# Patient Record
Sex: Female | Born: 1935 | Race: White | Hispanic: No | Smoking: Former smoker
Health system: Southern US, Community
[De-identification: ages and names within clinical notes are randomized; demographics above are authoritative.]

## PROBLEM LIST (undated history)

## (undated) DIAGNOSIS — I639 Cerebral infarction, unspecified: Secondary | ICD-10-CM

## (undated) DIAGNOSIS — S069XAA Unspecified intracranial injury with loss of consciousness status unknown, initial encounter: Secondary | ICD-10-CM

## (undated) DIAGNOSIS — R569 Unspecified convulsions: Secondary | ICD-10-CM

## (undated) DIAGNOSIS — S069X9A Unspecified intracranial injury with loss of consciousness of unspecified duration, initial encounter: Secondary | ICD-10-CM

## (undated) HISTORY — DX: Unspecified convulsions: R56.9

## (undated) HISTORY — DX: Cerebral infarction, unspecified: I63.9

## (undated) HISTORY — DX: Unspecified intracranial injury with loss of consciousness status unknown, initial encounter: S06.9XAA

## (undated) HISTORY — DX: Unspecified intracranial injury with loss of consciousness of unspecified duration, initial encounter: S06.9X9A

---

## 2010-07-25 ENCOUNTER — Encounter (INDEPENDENT_AMBULATORY_CARE_PROVIDER_SITE_OTHER): Payer: Self-pay | Admitting: Internal Medicine

## 2010-07-26 ENCOUNTER — Encounter (INDEPENDENT_AMBULATORY_CARE_PROVIDER_SITE_OTHER): Payer: Self-pay | Admitting: Internal Medicine

## 2010-08-17 ENCOUNTER — Inpatient Hospital Stay (HOSPITAL_COMMUNITY): Admission: EM | Admit: 2010-08-17 | Discharge: 2010-07-28 | Payer: Self-pay | Admitting: Emergency Medicine

## 2010-11-21 LAB — POCT I-STAT, CHEM 8
BUN: 21 mg/dL (ref 6–23)
Creatinine, Ser: 0.5 mg/dL (ref 0.4–1.2)
Glucose, Bld: 109 mg/dL — ABNORMAL HIGH (ref 70–99)
Potassium: 3.4 mEq/L — ABNORMAL LOW (ref 3.5–5.1)
Sodium: 141 mEq/L (ref 135–145)

## 2010-11-21 LAB — LIPID PANEL
Triglycerides: 103 mg/dL (ref <150)
VLDL: 21 mg/dL (ref 0–40)

## 2010-11-21 LAB — CK TOTAL AND CKMB (NOT AT ARMC)
CK, MB: 0.6 ng/mL (ref 0.3–4.0)
Relative Index: INVALID (ref 0.0–2.5)
Total CK: 32 U/L (ref 7–177)

## 2010-11-21 LAB — POCT CARDIAC MARKERS
CKMB, poc: <1 ng/mL — ABNORMAL LOW (ref 1.0–8.0)
Myoglobin, poc: 139 ng/mL (ref 12–200)

## 2010-11-21 LAB — CBC
HCT: 34.4 % — ABNORMAL LOW (ref 36.0–46.0)
Hemoglobin: 11.4 g/dL — ABNORMAL LOW (ref 12.0–15.0)
Hemoglobin: 12.9 g/dL (ref 12.0–15.0)
MCH: 31.9 pg (ref 26.0–34.0)
MCV: 94.2 fL (ref 78.0–100.0)
MCV: 95 fL (ref 78.0–100.0)
MCV: 95.6 fL (ref 78.0–100.0)
Platelets: 161 10*3/uL (ref 150–400)
Platelets: 183 10*3/uL (ref 150–400)
RBC: 3.6 MIL/uL — ABNORMAL LOW (ref 3.87–5.11)
RBC: 3.62 MIL/uL — ABNORMAL LOW (ref 3.87–5.11)
RBC: 4.04 MIL/uL (ref 3.87–5.11)
WBC: 3.5 10*3/uL — ABNORMAL LOW (ref 4.0–10.5)
WBC: 4.1 10*3/uL (ref 4.0–10.5)
WBC: 6.8 10*3/uL (ref 4.0–10.5)

## 2010-11-21 LAB — PROTIME-INR
INR: 1.04 (ref 0.00–1.49)
Prothrombin Time: 13.8 seconds (ref 11.6–15.2)

## 2010-11-21 LAB — GLUCOSE, CAPILLARY
Glucose-Capillary: 104 mg/dL — ABNORMAL HIGH (ref 70–99)
Glucose-Capillary: 105 mg/dL — ABNORMAL HIGH (ref 70–99)
Glucose-Capillary: 126 mg/dL — ABNORMAL HIGH (ref 70–99)
Glucose-Capillary: 180 mg/dL — ABNORMAL HIGH (ref 70–99)
Glucose-Capillary: 84 mg/dL (ref 70–99)
Glucose-Capillary: 92 mg/dL (ref 70–99)
Glucose-Capillary: 94 mg/dL (ref 70–99)
Glucose-Capillary: 94 mg/dL (ref 70–99)

## 2010-11-21 LAB — URINALYSIS, ROUTINE W REFLEX MICROSCOPIC
Glucose, UA: NEGATIVE mg/dL
Hgb urine dipstick: NEGATIVE
Ketones, ur: 15 mg/dL — AB
Protein, ur: NEGATIVE mg/dL
pH: 6 (ref 5.0–8.0)

## 2010-11-21 LAB — BASIC METABOLIC PANEL
Chloride: 108 mEq/L (ref 96–112)
Chloride: 112 mEq/L (ref 96–112)
Creatinine, Ser: 0.54 mg/dL (ref 0.4–1.2)
GFR calc Af Amer: >60 mL/min (ref >60)
GFR calc Af Amer: >60 mL/min (ref >60)
Potassium: 3.2 mEq/L — ABNORMAL LOW (ref 3.5–5.1)

## 2010-11-21 LAB — DIFFERENTIAL
Eosinophils Relative: 1 % (ref 0–5)
Eosinophils Relative: 4 % (ref 0–5)
Lymphocytes Relative: 27 % (ref 12–46)
Lymphocytes Relative: 31 % (ref 12–46)
Lymphs Abs: 1.1 10*3/uL (ref 0.7–4.0)
Lymphs Abs: 1.8 10*3/uL (ref 0.7–4.0)
Monocytes Absolute: 0.3 10*3/uL (ref 0.1–1.0)
Monocytes Relative: 8 % (ref 3–12)
Monocytes Relative: 9 % (ref 3–12)
Neutrophils Relative %: 63 % (ref 43–77)

## 2010-11-21 LAB — PHENYTOIN LEVEL, TOTAL: Phenytoin Lvl: 12.5 ug/mL (ref 10.0–20.0)

## 2010-11-21 LAB — COMPREHENSIVE METABOLIC PANEL
BUN: 18 mg/dL (ref 6–23)
Calcium: 8.9 mg/dL (ref 8.4–10.5)
Creatinine, Ser: 0.54 mg/dL (ref 0.4–1.2)
Glucose, Bld: 91 mg/dL (ref 70–99)
Total Protein: 7.1 g/dL (ref 6.0–8.3)

## 2010-11-21 LAB — MRSA PCR SCREENING: MRSA by PCR: NEGATIVE

## 2010-11-21 LAB — LEGIONELLA ANTIGEN, URINE: Legionella Antigen, Urine: NEGATIVE

## 2010-11-21 LAB — APTT: aPTT: 29 seconds (ref 24–37)

## 2010-11-21 LAB — TROPONIN I: Troponin I: 0.01 ng/mL (ref 0.00–0.06)

## 2012-08-04 ENCOUNTER — Telehealth: Payer: Self-pay | Admitting: *Deleted

## 2012-08-04 NOTE — Telephone Encounter (Signed)
Patient was denied micardis. [employer] needs documentation of the preferred list of medications that patient has tried in the past. [employer] stated that patient was very upset. They would like a reply back today because patient is almost out of med.

## 2012-08-04 NOTE — Telephone Encounter (Signed)
Please call pt and as her what meds she has tried for her blood pressure.  I know she has tried various meds.  Thanks.

## 2012-08-05 NOTE — Telephone Encounter (Signed)
This was documented in the wrong chart. I have copied and pasted this information into MRN [id number] which is the correct patient.

## 2012-08-05 NOTE — Telephone Encounter (Signed)
Attempted to call patient;"network difficulty with line"

## 2012-11-25 ENCOUNTER — Non-Acute Institutional Stay (SKILLED_NURSING_FACILITY): Payer: Medicare Other | Admitting: Internal Medicine

## 2012-11-25 DIAGNOSIS — E7800 Pure hypercholesterolemia, unspecified: Secondary | ICD-10-CM

## 2012-11-25 DIAGNOSIS — G250 Essential tremor: Secondary | ICD-10-CM

## 2012-11-25 DIAGNOSIS — K5900 Constipation, unspecified: Secondary | ICD-10-CM

## 2012-11-25 DIAGNOSIS — F329 Major depressive disorder, single episode, unspecified: Secondary | ICD-10-CM

## 2012-11-25 DIAGNOSIS — I6990 Unspecified sequelae of unspecified cerebrovascular disease: Secondary | ICD-10-CM

## 2012-11-25 DIAGNOSIS — R569 Unspecified convulsions: Secondary | ICD-10-CM

## 2012-11-25 DIAGNOSIS — G252 Other specified forms of tremor: Secondary | ICD-10-CM

## 2012-11-25 NOTE — Progress Notes (Signed)
Date: 11/25/2012  MRN:  [id number] Name:  Lauren Burch Sex:  female Age:  77 y.o. DOB:[birth year]  Routine visit   Chief complaint: Manage sz d/o, CVA & hyperlipidemia  HPI: Reassessment of ongoing problems: Hyperlipidemia: Pt is on a statin & tolerates it without any side effects.  In 11/13 FLP nl.  LDL is at goal.  CVA: pt's neurological status is stable.  The pt denies loss of vision, transient ischemic sx, sudden weakness or numbness, difficulty swallowing, or confusion.  No complications from the medications presently being used.  She is s/p right MCA distribution infarct.  She improved remarkably on rehab & now residing as a long term care resident.  Seizure d/o: she is tolerating her anti-sz meds without any side effects.  Staff do not report any sz activity.  PMH: Reviewed.  Medications: Reviewed per MAR.  ROS: Gen: no change in appetite, fatigue or change in wt Respiratory: no cough, DOE, sob or wheezing Cardiac: no chest pain, edema or palpitations GI: no abd. Pain, diarrhea, constipation, heart burn, nausea or vomiting  Physical Exam:  VS: 115/68    72    18    97.6  Gen: NAD, obese Neck: No JVD, no neck masses, trachea midline, no thyromegaly Respiratory: breathing is even & unlabored, BS CTAB Cardiac: RRR, no murmurs, no edema, no extra heart sounds GI: normal BS, NT, ND, no hepatosplenomegaly  Labs:  11/13 dilantin level 10.7, phenobarbital level 24.9, Hb 11.2, mcv 87 ow cbc nl, cmp nl  Assessment/Plan:  1) Hyperlipidemia- LDL at goal.  Cont. Statin. 2) Depression- denies sx.  Cont. Med. 3) Essential tremor- on neurontin. 4) CVA- stable. 5) Constipation- well controlled. 6) Sz d/o- well controlled.

## 2012-11-28 ENCOUNTER — Other Ambulatory Visit: Payer: Self-pay | Admitting: *Deleted

## 2012-11-28 MED ORDER — PHENOBARBITAL 32.4 MG PO TABS: ORAL_TABLET | ORAL | Status: DC

## 2012-12-12 ENCOUNTER — Other Ambulatory Visit: Payer: Self-pay | Admitting: *Deleted

## 2012-12-12 MED ORDER — PHENOBARBITAL 64.8 MG PO TABS: ORAL_TABLET | ORAL | Status: DC

## 2013-01-12 ENCOUNTER — Other Ambulatory Visit: Payer: Self-pay | Admitting: Geriatric Medicine

## 2013-01-12 MED ORDER — PHENOBARBITAL 64.8 MG PO TABS: ORAL_TABLET | ORAL | Status: DC

## 2013-03-05 ENCOUNTER — Non-Acute Institutional Stay (SKILLED_NURSING_FACILITY): Payer: Medicare Other | Admitting: Internal Medicine

## 2013-03-05 DIAGNOSIS — I6990 Unspecified sequelae of unspecified cerebrovascular disease: Secondary | ICD-10-CM | POA: Insufficient documentation

## 2013-03-05 DIAGNOSIS — E7800 Pure hypercholesterolemia, unspecified: Secondary | ICD-10-CM

## 2013-03-05 DIAGNOSIS — R569 Unspecified convulsions: Secondary | ICD-10-CM

## 2013-03-05 DIAGNOSIS — K5900 Constipation, unspecified: Secondary | ICD-10-CM

## 2013-03-05 NOTE — Progress Notes (Signed)
PROGRESS NOTE  DATE: 03/05/2013  FACILITY: Nursing Home Location: [facility]  LEVEL OF CARE: SNF (31)  Routine Visit  CHIEF COMPLAINT:  Manage Sz disorder & CVA  HISTORY OF PRESENT ILLNESS:  REASSESSMENT OF ONGOING PROBLEM(S):  SEIZURE DISORDER: The patient's seizure disorder remains stable. No complications reported from the medications presently being used. Staff do not report any recent seizure activity.  CVA: The patient's CVA remains stable.  Patient denies new neurologic symptoms such as numbness, tingling, weakness, speech difficulties or visual disturbances.  No complications reported from the medications currently being used.  PAST MEDICAL HISTORY : Reviewed.  No changes.  CURRENT MEDICATIONS: Reviewed per [facility]  REVIEW OF SYSTEMS:  GENERAL: no change in appetite, no fatigue, no weight changes, no fever, chills or weakness RESPIRATORY: no cough, SOB, DOE, wheezing, hemoptysis CARDIAC: no chest pain, edema or palpitations GI: no abdominal pain, diarrhea, constipation, heart burn, nausea or vomiting  PHYSICAL EXAMINATION  VS:  T 97.5       P75      RR18      BP 107/71     POX % 98     WT (Lb)  GENERAL: no acute distress, obese body habitus NECK: supple, trachea midline, no neck masses, no thyroid tenderness, no thyromegaly RESPIRATORY: breathing is even & unlabored, BS CTAB CARDIAC: RRR, no murmur,no extra heart sounds, no edema GI: abdomen soft, normal BS, no masses, no tenderness, no hepatomegaly, no splenomegaly PSYCHIATRIC: the patient is alert & oriented to person, affect & behavior appropriate  LABS/RADIOLOGY:  6/14 FLP nl, liver profile nl, dilantin level 16.3, cbc nl, cmp nl  ASSESSMENT/PLAN:  Sz disorder-stable. CVA-no new sx. Hyperlipidemia-well controlled. Constipation-well controlled. GERD-stable. Essential tremor-on neurontin. Check phenobarbital level.  CPT CODE: 40981

## 2013-03-26 ENCOUNTER — Non-Acute Institutional Stay (SKILLED_NURSING_FACILITY): Payer: Medicare Other | Admitting: Internal Medicine

## 2013-03-26 DIAGNOSIS — I6990 Unspecified sequelae of unspecified cerebrovascular disease: Secondary | ICD-10-CM

## 2013-03-26 DIAGNOSIS — R569 Unspecified convulsions: Secondary | ICD-10-CM

## 2013-03-26 DIAGNOSIS — E7800 Pure hypercholesterolemia, unspecified: Secondary | ICD-10-CM

## 2013-03-26 DIAGNOSIS — K5900 Constipation, unspecified: Secondary | ICD-10-CM

## 2013-03-30 NOTE — Progress Notes (Signed)
PROGRESS NOTE  DATE: 03-26-13  FACILITY: Nursing Home Location: [facility]  LEVEL OF CARE: SNF (31)  Routine Visit  CHIEF COMPLAINT:  Manage Sz disorder & CVA  HISTORY OF PRESENT ILLNESS:  REASSESSMENT OF ONGOING PROBLEM(S):  SEIZURE DISORDER: The patient's seizure disorder remains stable. No complications reported from the medications presently being used. Staff do not report any recent seizure activity.  CVA: The patient's CVA remains stable.  Patient denies new neurologic symptoms such as numbness, tingling, weakness, speech difficulties or visual disturbances.  No complications reported from the medications currently being used.  PAST MEDICAL HISTORY : Reviewed.  No changes.  CURRENT MEDICATIONS: Reviewed per [facility]  REVIEW OF SYSTEMS:  GENERAL: no change in appetite, no fatigue, no weight changes, no fever, chills or weakness RESPIRATORY: no cough, SOB, DOE, wheezing, hemoptysis CARDIAC: no chest pain, edema or palpitations GI: no abdominal pain, diarrhea, constipation, heart burn, nausea or vomiting  PHYSICAL EXAMINATION  VS:  T 98.8       P88      RR22      BP 139/72     POX % 95     WT (Lb)  GENERAL: no acute distress, obese body habitus NECK: supple, trachea midline, no neck masses, no thyroid tenderness, no thyromegaly RESPIRATORY: breathing is even & unlabored, BS CTAB CARDIAC: RRR, no murmur,no extra heart sounds, no edema GI: abdomen soft, normal BS, no masses, no tenderness, no hepatomegaly, no splenomegaly PSYCHIATRIC: the patient is alert & oriented to person, affect & behavior appropriate  LABS/RADIOLOGY:  6/14 FLP nl, liver profile nl, dilantin level 16.3, cbc nl, cmp nl, phenobarbital 36.6  ASSESSMENT/PLAN:  Sz disorder-stable. CVA-no new sx. Hyperlipidemia-well controlled. Constipation-well controlled. GERD-stable. Essential tremor-on neurontin  CPT CODE: 16109

## 2013-05-01 ENCOUNTER — Non-Acute Institutional Stay (SKILLED_NURSING_FACILITY): Payer: Medicare Other | Admitting: Adult Health

## 2013-05-01 DIAGNOSIS — K5900 Constipation, unspecified: Secondary | ICD-10-CM

## 2013-05-01 DIAGNOSIS — R569 Unspecified convulsions: Secondary | ICD-10-CM

## 2013-05-01 DIAGNOSIS — K219 Gastro-esophageal reflux disease without esophagitis: Secondary | ICD-10-CM

## 2013-05-01 DIAGNOSIS — G250 Essential tremor: Secondary | ICD-10-CM

## 2013-05-01 DIAGNOSIS — E7800 Pure hypercholesterolemia, unspecified: Secondary | ICD-10-CM

## 2013-05-01 DIAGNOSIS — I6990 Unspecified sequelae of unspecified cerebrovascular disease: Secondary | ICD-10-CM

## 2013-05-01 DIAGNOSIS — J301 Allergic rhinitis due to pollen: Secondary | ICD-10-CM

## 2013-05-01 DIAGNOSIS — Z9109 Other allergy status, other than to drugs and biological substances: Secondary | ICD-10-CM

## 2013-05-05 DIAGNOSIS — Z9109 Other allergy status, other than to drugs and biological substances: Secondary | ICD-10-CM | POA: Insufficient documentation

## 2013-05-05 DIAGNOSIS — K219 Gastro-esophageal reflux disease without esophagitis: Secondary | ICD-10-CM | POA: Insufficient documentation

## 2013-05-05 NOTE — Progress Notes (Signed)
Patient ID: Lauren Burch, female   DOB: Mar 14, 1936, 77 y.o.   MRN: [id number]       PROGRESS NOTE  DATE: 05/01/2013  FACILITY: Nursing Home Location: [facility]  LEVEL OF CARE: SNF (31)  Routine Visit  CHIEF COMPLAINT:  Manage seizure, essential tremor, hyperlipidemia and GERD  HISTORY OF PRESENT ILLNESS:  REASSESSMENT OF ONGOING PROBLEM(S):  SEIZURE DISORDER: The patient's seizure disorder remains stable. No complications reported from the medications presently being used. Staff do not report any recent seizure activity.  GERD: pt's GERD is stable.  Denies ongoing heartburn, abd. Pain, nausea or vomiting.  Currently on a PPI & tolerates it without any adverse reactions.  CVA: The patient's CVA remains stable.  Patient denies new neurologic symptoms such as numbness, tingling, weakness, speech difficulties or visual disturbances.  No complications reported from the medications currently being used.  PAST MEDICAL HISTORY : Reviewed.  No changes.  CURRENT MEDICATIONS: Reviewed per [facility]  REVIEW OF SYSTEMS:  GENERAL: no change in appetite, no fatigue, no weight changes, no fever, chills or weakness RESPIRATORY: no cough, SOB, DOE, wheezing, hemoptysis CARDIAC: no chest pain, edema or palpitations GI: no abdominal pain, diarrhea, constipation, heart burn, nausea or vomiting  PHYSICAL EXAMINATION  VS:  T97.9       P70      RR18      BP143/68          WT 198.4(Lb)  GENERAL: no acute distress, normal body habitus NECK: supple, trachea midline, no neck masses, no thyroid tenderness, no thyromegaly LYMPHATICS: no LAN in the neck, no supraclavicular LAN RESPIRATORY: breathing is even & unlabored, BS CTAB CARDIAC: RRR, no murmur,no extra heart sounds, no edema GI: abdomen soft, normal BS, no masses, no tenderness, no hepatomegaly, no splenomegaly PSYCHIATRIC: the patient is alert & oriented to person, affect & behavior appropriate  LABS/RADIOLOGY: 6/14 FLP nl, liver  profile nl, dilantin level 16.3, cbc nl, cmp nl, phenobarbital 36.6   ASSESSMENT/PLAN:  CVA - stable; no new symptoms  Seizure - stable  Hyperlipidemia - well controlled  Constipation - no complaints  GERD - stable  Essential tremor - stable  Allergy to pollen - stable  CPT CODE: 40981

## 2013-05-12 ENCOUNTER — Other Ambulatory Visit: Payer: Self-pay | Admitting: *Deleted

## 2013-05-12 MED ORDER — PHENOBARBITAL 64.8 MG PO TABS: ORAL_TABLET | ORAL | Status: DC

## 2013-05-28 ENCOUNTER — Non-Acute Institutional Stay (SKILLED_NURSING_FACILITY): Payer: Medicare Other | Admitting: Adult Health

## 2013-05-28 DIAGNOSIS — Z9109 Other allergy status, other than to drugs and biological substances: Secondary | ICD-10-CM

## 2013-05-28 DIAGNOSIS — J301 Allergic rhinitis due to pollen: Secondary | ICD-10-CM

## 2013-05-28 DIAGNOSIS — B372 Candidiasis of skin and nail: Secondary | ICD-10-CM

## 2013-05-28 DIAGNOSIS — E7800 Pure hypercholesterolemia, unspecified: Secondary | ICD-10-CM

## 2013-05-28 DIAGNOSIS — R569 Unspecified convulsions: Secondary | ICD-10-CM

## 2013-05-28 DIAGNOSIS — I6990 Unspecified sequelae of unspecified cerebrovascular disease: Secondary | ICD-10-CM

## 2013-05-28 DIAGNOSIS — K219 Gastro-esophageal reflux disease without esophagitis: Secondary | ICD-10-CM

## 2013-05-28 DIAGNOSIS — G250 Essential tremor: Secondary | ICD-10-CM

## 2013-05-28 DIAGNOSIS — K5900 Constipation, unspecified: Secondary | ICD-10-CM

## 2013-06-01 ENCOUNTER — Other Ambulatory Visit: Payer: Self-pay | Admitting: *Deleted

## 2013-06-01 MED ORDER — PHENOBARBITAL 32.4 MG PO TABS: ORAL_TABLET | ORAL | Status: DC

## 2013-06-09 NOTE — Progress Notes (Signed)
Patient ID: Lauren Burch, female   DOB: 1935/12/14, 77 y.o.   MRN: [id number]       PROGRESS NOTE  DATE:  05/28/13  FACILITY: Nursing Home Location: [facility]  LEVEL OF CARE: SNF (31)  Routine Visit  CHIEF COMPLAINT:  Manage seizure, essential tremor, hyperlipidemia and GERD  HISTORY OF PRESENT ILLNESS:  REASSESSMENT OF ONGOING PROBLEM(S):  HYPERLIPIDEMIA: No complications from the medications presently being used. 6/14 fasting lipid panel  cholesterol 166 triglycerides 103 HDL 66 LDL 79 - normal  SEIZURE DISORDER: The patient's seizure disorder remains stable. No complications reported from the medications presently being used. Staff do not report any recent seizure activity.  GERD: pt's GERD is stable.  Denies ongoing heartburn, abd. Pain, nausea or vomiting.  Currently on a PPI & tolerates it without any adverse reactions.  PAST MEDICAL HISTORY : Reviewed.  No changes.  CURRENT MEDICATIONS: Reviewed per [facility]  REVIEW OF SYSTEMS:  GENERAL: no change in appetite, no fatigue, no weight changes, no fever, chills or weakness  SKIN: pruritic rashes RESPIRATORY: no cough, SOB, DOE, wheezing, hemoptysis CARDIAC: no chest pain, edema or palpitations GI: no abdominal pain, diarrhea, constipation, heart burn, nausea or vomiting  PHYSICAL EXAMINATION  VS:  T 97.8    P 81    RR18      BP 131/56          WT 195 (Lb)  GENERAL: no acute distress, normal body habitus SKIN:  Erythematous rashes to abdominal folds and left groin NECK: supple, trachea midline, no neck masses, no thyroid tenderness, no thyromegaly RESPIRATORY: breathing is even & unlabored, BS CTAB CARDIAC: RRR, no murmur,no extra heart sounds, no edema GI: abdomen soft, normal BS, no masses, no tenderness, no hepatomegaly, no splenomegaly PSYCHIATRIC: the patient is alert & oriented to person, affect & behavior appropriate  LABS/RADIOLOGY: 6/14 FLP nl, liver profile nl, dilantin level 16.3, cbc nl, cmp  nl, phenobarbital 36.6   ASSESSMENT/PLAN:  Candidal skin infection - apply nystatin 100,000 units/gram powder to abdominal folds in the left groin rash has BID x2 weeks  CVA - stable; no new symptoms  Seizure - stable  Hyperlipidemia - well controlled  Constipation - no complaints  GERD - stable  Essential tremor - stable  Allergy to pollen - stable  CPT CODE: 78295

## 2013-07-03 ENCOUNTER — Non-Acute Institutional Stay (SKILLED_NURSING_FACILITY): Payer: PRIVATE HEALTH INSURANCE | Admitting: Internal Medicine

## 2013-07-03 DIAGNOSIS — E7800 Pure hypercholesterolemia, unspecified: Secondary | ICD-10-CM

## 2013-07-03 DIAGNOSIS — K5900 Constipation, unspecified: Secondary | ICD-10-CM

## 2013-07-03 DIAGNOSIS — R569 Unspecified convulsions: Secondary | ICD-10-CM

## 2013-07-03 DIAGNOSIS — I6990 Unspecified sequelae of unspecified cerebrovascular disease: Secondary | ICD-10-CM

## 2013-07-03 NOTE — Progress Notes (Signed)
PROGRESS NOTE  DATE: 07-03-13  FACILITY: Nursing Home Location: [facility]  LEVEL OF CARE: SNF (31)  Routine Visit  CHIEF COMPLAINT:  Manage Sz disorder, constipation & CVA  HISTORY OF PRESENT ILLNESS:  REASSESSMENT OF ONGOING PROBLEM(S):  CONSTIPATION: The constipation remains stable. No complications from the medications presently being used. Patient denies ongoing constipation, abdominal pain, nausea or vomiting.  SEIZURE DISORDER: The patient's seizure disorder remains stable. No complications reported from the medications presently being used. Staff do not report any recent seizure activity.  CVA: The patient's CVA remains stable.  Patient denies new neurologic symptoms such as numbness, tingling, weakness, speech difficulties or visual disturbances.  No complications reported from the medications currently being used.  PAST MEDICAL HISTORY : Reviewed.  No changes.  CURRENT MEDICATIONS: Reviewed per [facility]  REVIEW OF SYSTEMS:  GENERAL: no change in appetite, no fatigue, no weight changes, no fever, chills or weakness RESPIRATORY: no cough, SOB, DOE, wheezing, hemoptysis CARDIAC: no chest pain, edema or palpitations GI: no abdominal pain, diarrhea, constipation, heart burn, nausea or vomiting  PHYSICAL EXAMINATION  VS:  T 97.8       P85      RR20      BP 130/78     POX % 99     WT (Lb)  GENERAL: no acute distress, obese body habitus EYES: nl sclerae, nl conjunctivae, no discharge NECK: supple, trachea midline, no neck masses, no thyroid tenderness, no thyromegaly LYMPHATICS: no cervical LAN, no supraclavicular LAN RESPIRATORY: breathing is even & unlabored, BS CTAB CARDIAC: RRR, no murmur,no extra heart sounds, no edema GI: abdomen soft, normal BS, no masses, no tenderness, no hepatomegaly, no splenomegaly PSYCHIATRIC: the patient is alert & oriented to person, affect & behavior appropriate  LABS/RADIOLOGY:  6/14 FLP nl, liver profile nl,  dilantin level 16.3, cbc nl, cmp nl, phenobarbital 36.6  ASSESSMENT/PLAN:  Sz disorder-stable. CVA-no new sx. Hyperlipidemia-well controlled. Constipation-well controlled. GERD-stable. Essential tremor-on neurontin  CPT CODE: 96045

## 2013-07-22 ENCOUNTER — Non-Acute Institutional Stay (SKILLED_NURSING_FACILITY): Payer: PRIVATE HEALTH INSURANCE | Admitting: Internal Medicine

## 2013-07-22 DIAGNOSIS — K5900 Constipation, unspecified: Secondary | ICD-10-CM

## 2013-07-22 DIAGNOSIS — I6990 Unspecified sequelae of unspecified cerebrovascular disease: Secondary | ICD-10-CM

## 2013-07-22 DIAGNOSIS — E7800 Pure hypercholesterolemia, unspecified: Secondary | ICD-10-CM

## 2013-07-22 DIAGNOSIS — R569 Unspecified convulsions: Secondary | ICD-10-CM

## 2013-07-24 NOTE — Progress Notes (Signed)
PROGRESS NOTE  DATE: 07-22-13  FACILITY: Nursing Home Location: [facility]  LEVEL OF CARE: SNF (31)  Routine Visit  CHIEF COMPLAINT:  Manage Sz disorder, constipation & CVA  HISTORY OF PRESENT ILLNESS:  REASSESSMENT OF ONGOING PROBLEM(S):  CONSTIPATION: The constipation remains stable. No complications from the medications presently being used. Patient denies ongoing constipation, abdominal pain, nausea or vomiting.  SEIZURE DISORDER: The patient's seizure disorder remains stable. No complications reported from the medications presently being used. Staff do not report any recent seizure activity.  CVA: The patient's CVA remains stable.  Patient denies new neurologic symptoms such as numbness, tingling, weakness, speech difficulties or visual disturbances.  No complications reported from the medications currently being used.  PAST MEDICAL HISTORY : Reviewed.  No changes.  CURRENT MEDICATIONS: Reviewed per [facility]  REVIEW OF SYSTEMS:  GENERAL: no change in appetite, no fatigue, no weight changes, no fever, chills or weakness RESPIRATORY: no cough, SOB, DOE, wheezing, hemoptysis CARDIAC: no chest pain, edema or palpitations GI: no abdominal pain, diarrhea, constipation, heart burn, nausea or vomiting  PHYSICAL EXAMINATION  VS:  T 97.1    P 71      RR18      BP 100/63     POX % 98    WT (Lb)  GENERAL: no acute distress, obese body habitus EYES: nl sclerae, nl conjunctivae, no discharge NECK: supple, trachea midline, no neck masses, no thyroid tenderness, no thyromegaly LYMPHATICS: no cervical LAN, no supraclavicular LAN RESPIRATORY: breathing is even & unlabored, BS CTAB CARDIAC: RRR, no murmur,no extra heart sounds, no edema GI: abdomen soft, normal BS, no masses, no tenderness, no hepatomegaly, no splenomegaly PSYCHIATRIC: the patient is alert & oriented to person, affect & behavior appropriate  LABS/RADIOLOGY:  6/14 FLP nl, liver profile nl,  dilantin level 16.3, cbc nl, cmp nl, phenobarbital 36.6  ASSESSMENT/PLAN:  Sz disorder-stable. CVA-no new sx. Hyperlipidemia-well controlled. Constipation-well controlled. GERD-stable. Essential tremor-on neurontin  CPT CODE: 40981

## 2013-08-19 ENCOUNTER — Non-Acute Institutional Stay (SKILLED_NURSING_FACILITY): Payer: PRIVATE HEALTH INSURANCE | Admitting: Internal Medicine

## 2013-08-19 DIAGNOSIS — K5900 Constipation, unspecified: Secondary | ICD-10-CM

## 2013-08-19 DIAGNOSIS — E7800 Pure hypercholesterolemia, unspecified: Secondary | ICD-10-CM

## 2013-08-19 DIAGNOSIS — R569 Unspecified convulsions: Secondary | ICD-10-CM

## 2013-08-19 DIAGNOSIS — I6990 Unspecified sequelae of unspecified cerebrovascular disease: Secondary | ICD-10-CM

## 2013-08-21 ENCOUNTER — Encounter: Payer: Self-pay | Admitting: Internal Medicine

## 2013-08-21 NOTE — Progress Notes (Signed)
PROGRESS NOTE  DATE: 08-19-13  FACILITY: Nursing Home Location: [facility]  LEVEL OF CARE: SNF (31)  Routine Visit  CHIEF COMPLAINT:  Manage Sz disorder, constipation & CVA  HISTORY OF PRESENT ILLNESS:  REASSESSMENT OF ONGOING PROBLEM(S):  CONSTIPATION: The constipation remains stable. No complications from the medications presently being used. Patient denies ongoing constipation, abdominal pain, nausea or vomiting.  SEIZURE DISORDER: The patient's seizure disorder remains stable. No complications reported from the medications presently being used. Staff do not report any recent seizure activity.  CVA: The patient's CVA remains stable.  Patient denies new neurologic symptoms such as numbness, tingling, weakness, speech difficulties or visual disturbances.  No complications reported from the medications currently being used.  PAST MEDICAL HISTORY : Reviewed.  No changes.  CURRENT MEDICATIONS: Reviewed per [facility]  REVIEW OF SYSTEMS:  GENERAL: no change in appetite, no fatigue, no weight changes, no fever, chills or weakness RESPIRATORY: no cough, SOB, DOE, wheezing, hemoptysis CARDIAC: no chest pain, edema or palpitations GI: no abdominal pain, diarrhea, constipation, heart burn, nausea or vomiting  PHYSICAL EXAMINATION  VS:  T 97.5    P 68      RR 20      BP 129/60     POX % 96    WT (Lb)  GENERAL: no acute distress, obese body habitus EYES: nl sclerae, nl conjunctivae, no discharge NECK: supple, trachea midline, no neck masses, no thyroid tenderness, no thyromegaly LYMPHATICS: no cervical LAN, no supraclavicular LAN RESPIRATORY: breathing is even & unlabored, BS CTAB CARDIAC: RRR, no murmur,no extra heart sounds, no edema GI: abdomen soft, normal BS, no masses, no tenderness, no hepatomegaly, no splenomegaly PSYCHIATRIC: the patient is alert & oriented to person, affect & behavior appropriate  LABS/RADIOLOGY:  11-14 fasting lipid panel normal,  phenobarbital level 33.1, Dilantin 17.4, glucose 138 otherwise BMP normal, liver profile normal, CBC normal  6/14 FLP nl, liver profile nl, dilantin level 16.3, cbc nl, cmp nl, phenobarbital 36.6  ASSESSMENT/PLAN:  Sz disorder-stable. CVA-no new sx. Hyperlipidemia-well controlled. Constipation-well controlled. GERD-stable. Essential tremor-on neurontin  CPT CODE: 16109

## 2013-09-25 ENCOUNTER — Non-Acute Institutional Stay (SKILLED_NURSING_FACILITY): Payer: PRIVATE HEALTH INSURANCE | Admitting: Internal Medicine

## 2013-09-25 DIAGNOSIS — E7800 Pure hypercholesterolemia, unspecified: Secondary | ICD-10-CM

## 2013-09-25 DIAGNOSIS — R569 Unspecified convulsions: Secondary | ICD-10-CM

## 2013-09-25 DIAGNOSIS — K5900 Constipation, unspecified: Secondary | ICD-10-CM

## 2013-09-25 DIAGNOSIS — I6990 Unspecified sequelae of unspecified cerebrovascular disease: Secondary | ICD-10-CM

## 2013-09-25 NOTE — Progress Notes (Signed)
         PROGRESS NOTE  DATE: 09-25-13  FACILITY: Nursing Home Location: [facility]  LEVEL OF CARE: SNF (31)  Routine Visit  CHIEF COMPLAINT:  Manage Sz disorder, constipation & CVA  HISTORY OF PRESENT ILLNESS:  REASSESSMENT OF ONGOING PROBLEM(S):  CONSTIPATION: The constipation remains stable. No complications from the medications presently being used. Patient denies ongoing constipation, abdominal pain, nausea or vomiting.  SEIZURE DISORDER: The patient's seizure disorder remains stable. No complications reported from the medications presently being used. Staff do not report any recent seizure activity.  CVA: The patient's CVA remains stable.  Patient denies new neurologic symptoms such as numbness, tingling, weakness, speech difficulties or visual disturbances.  No complications reported from the medications currently being used.  PAST MEDICAL HISTORY : Reviewed.  No changes.  [facility]S: [facility]  REVIEW OF SYSTEMS:  GENERAL: no change in appetite, no fatigue, no weight changes, no fever, chills or weakness RESPIRATORY: no cough, SOB, DOE, wheezing, hemoptysis CARDIAC: no chest pain, edema or palpitations GI: no abdominal pain, diarrhea, constipation, heart burn, nausea or vomiting  PHYSICAL EXAMINATION  VS:  T 97.7    P 77      RR 18      BP 111/66     POX % 97     GENERAL: no acute distress, obese body habitus EYES: nl sclerae, nl conjunctivae, no discharge NECK: supple, trachea midline, no neck masses, no thyroid tenderness, no thyromegaly LYMPHATICS: no cervical LAN, no supraclavicular LAN RESPIRATORY: breathing is even & unlabored, BS CTAB CARDIAC: RRR, no murmur,no extra heart sounds, no edema GI: abdomen soft, normal BS, no masses, no tenderness, no hepatomegaly, no splenomegaly PSYCHIATRIC: the patient is alert & oriented to person, affect & behavior appropriate  LABS/RADIOLOGY:  11-14 fasting lipid panel normal,  phenobarbital level 33.1, Dilantin 17.4, glucose 138 otherwise BMP normal, liver profile normal, CBC normal  6/14 FLP nl, liver profile nl, dilantin level 16.3, cbc nl, cmp nl, phenobarbital 36.6  ASSESSMENT/PLAN:  Sz disorder-stable. CVA-no new sx. Hyperlipidemia-well controlled. Constipation-well controlled. GERD-stable. Essential tremor-on neurontin  CPT CODE: [id number]

## 2013-11-05 ENCOUNTER — Encounter: Payer: Self-pay | Admitting: *Deleted

## 2013-11-09 ENCOUNTER — Other Ambulatory Visit: Payer: Self-pay | Admitting: *Deleted

## 2013-11-09 MED ORDER — PHENOBARBITAL 64.8 MG PO TABS: ORAL_TABLET | ORAL | Status: DC

## 2013-11-09 NOTE — Telephone Encounter (Signed)
[facility] 

## 2013-11-25 ENCOUNTER — Other Ambulatory Visit: Payer: Self-pay | Admitting: *Deleted

## 2013-11-25 MED ORDER — PHENOBARBITAL 32.4 MG PO TABS: ORAL_TABLET | ORAL | Status: DC

## 2013-11-25 NOTE — Telephone Encounter (Signed)
[facility] 

## 2013-12-29 ENCOUNTER — Non-Acute Institutional Stay (SKILLED_NURSING_FACILITY): Payer: PRIVATE HEALTH INSURANCE | Admitting: Internal Medicine

## 2013-12-29 DIAGNOSIS — I6990 Unspecified sequelae of unspecified cerebrovascular disease: Secondary | ICD-10-CM

## 2013-12-29 DIAGNOSIS — K5900 Constipation, unspecified: Secondary | ICD-10-CM

## 2013-12-29 DIAGNOSIS — R569 Unspecified convulsions: Secondary | ICD-10-CM

## 2013-12-29 DIAGNOSIS — E7800 Pure hypercholesterolemia, unspecified: Secondary | ICD-10-CM

## 2013-12-30 NOTE — Progress Notes (Signed)
          PROGRESS NOTE  DATE: 12-29-13  FACILITY: Nursing Home Location: [facility]  LEVEL OF CARE: SNF (31)  Routine Visit  CHIEF COMPLAINT:  Manage Sz disorder, constipation & CVA  HISTORY OF PRESENT ILLNESS:  REASSESSMENT OF ONGOING PROBLEM(S):  CONSTIPATION: The constipation remains stable. No complications from the medications presently being used. Patient denies ongoing constipation, abdominal pain, nausea or vomiting.  SEIZURE DISORDER: The patient's seizure disorder remains stable. No complications reported from the medications presently being used. Staff do not report any recent seizure activity.  CVA: The patient's CVA remains stable.  Patient denies new neurologic symptoms such as numbness, tingling, weakness, speech difficulties or visual disturbances.  No complications reported from the medications currently being used.  PAST MEDICAL HISTORY : Reviewed.  No changes.  [facility]  REVIEW OF SYSTEMS:  GENERAL: no change in appetite, no fatigue, no weight changes, no fever, chills or weakness RESPIRATORY: no cough, SOB, DOE, wheezing, hemoptysis CARDIAC: no chest pain, edema or palpitations GI: no abdominal pain, diarrhea, constipation, heart burn, nausea or vomiting  PHYSICAL EXAMINATION  VS: See VS Section    GENERAL: no acute distress, obese body habitus EYES: nl sclerae, nl conjunctivae, no discharge NECK: supple, trachea midline, no neck masses, no thyroid tenderness, no thyromegaly LYMPHATICS: no cervical LAN, no supraclavicular LAN RESPIRATORY: breathing is even & unlabored, BS CTAB CARDIAC: RRR, no murmur,no extra heart sounds, no edema GI: abdomen soft, normal BS, no masses, no tenderness, no hepatomegaly, no splenomegaly PSYCHIATRIC: the patient is alert & oriented to person, affect & behavior appropriate  LABS/RADIOLOGY: 3-15 CBC normal, glucose 152 otherwise BMP normal, phenobarbital level 29.7,  Dilantin level 15.5 11-14 fasting lipid panel normal, phenobarbital level 33.1, Dilantin 17.4, glucose 138 otherwise BMP normal, liver profile normal, CBC normal  6/14 FLP nl, liver profile nl, dilantin level 16.3, cbc nl, cmp nl, phenobarbital 36.6  ASSESSMENT/PLAN:  Sz disorder-stable. CVA-no new sx. Hyperlipidemia-well controlled. Constipation-well controlled. GERD-stable. Essential tremor-on neurontin Allergic rhinitis-Claritin was started  CPT CODE: [id number]

## 2014-01-28 ENCOUNTER — Non-Acute Institutional Stay (SKILLED_NURSING_FACILITY): Payer: PRIVATE HEALTH INSURANCE | Admitting: Internal Medicine

## 2014-01-28 DIAGNOSIS — K5900 Constipation, unspecified: Secondary | ICD-10-CM

## 2014-01-28 DIAGNOSIS — R569 Unspecified convulsions: Secondary | ICD-10-CM

## 2014-01-28 DIAGNOSIS — I6990 Unspecified sequelae of unspecified cerebrovascular disease: Secondary | ICD-10-CM

## 2014-01-28 DIAGNOSIS — E7800 Pure hypercholesterolemia, unspecified: Secondary | ICD-10-CM

## 2014-01-29 NOTE — Progress Notes (Signed)
         PROGRESS NOTE  DATE: 01-28-14  FACILITY: Nursing Home Location: [facility]  LEVEL OF CARE: SNF (31)  Routine Visit  CHIEF COMPLAINT:  Manage Sz disorder, constipation & CVA  HISTORY OF PRESENT ILLNESS:  REASSESSMENT OF ONGOING PROBLEM(S):  CONSTIPATION: The constipation remains stable. No complications from the medications presently being used. Patient denies ongoing constipation, abdominal pain, nausea or vomiting.  SEIZURE DISORDER: The patient's seizure disorder remains stable. No complications reported from the medications presently being used. Staff do not report any recent seizure activity.  CVA: The patient's CVA remains stable.  Patient denies new neurologic symptoms such as numbness, tingling, weakness, speech difficulties or visual disturbances.  No complications reported from the medications currently being used.  PAST MEDICAL HISTORY : Reviewed.  No changes.  CURRENT MEDICATIONS: Reviewed per [facility]  REVIEW OF SYSTEMS:  GENERAL: no change in appetite, no fatigue, no weight changes, no fever, chills or weakness RESPIRATORY: no cough, SOB, DOE, wheezing, hemoptysis CARDIAC: no chest pain, edema or palpitations GI: no abdominal pain, diarrhea, constipation, heart burn, nausea or vomiting  PHYSICAL EXAMINATION  VS: See VS Section    GENERAL: no acute distress, obese body habitus EYES: nl sclerae, nl conjunctivae, no discharge NECK: supple, trachea midline, no neck masses, no thyroid tenderness, no thyromegaly LYMPHATICS: no cervical LAN, no supraclavicular LAN RESPIRATORY: breathing is even & unlabored, BS CTAB CARDIAC: RRR, no murmur,no extra heart sounds, no edema GI: abdomen soft, normal BS, no masses, no tenderness, no hepatomegaly, no splenomegaly PSYCHIATRIC: the patient is alert & oriented to person, affect & behavior appropriate  LABS/RADIOLOGY: 3-15 CBC normal, glucose 152 otherwise BMP normal, phenobarbital level 29.7, Dilantin  level 15.5 11-14 fasting lipid panel normal, phenobarbital level 33.1, Dilantin 17.4, glucose 138 otherwise BMP normal, liver profile normal, CBC normal  6/14 FLP nl, liver profile nl, dilantin level 16.3, cbc nl, cmp nl, phenobarbital 36.6  ASSESSMENT/PLAN:  Sz disorder-stable. CVA-no new sx. Hyperlipidemia-check fasting lipid panel Constipation-Colace was started GERD-stable. Essential tremor-on neurontin Allergic rhinitis-Claritin was started Check liver profile  CPT CODE: 76734  Newton Pigg. Kerry Dory, MD [facility] [phone]

## 2014-02-02 ENCOUNTER — Other Ambulatory Visit: Payer: Self-pay

## 2014-02-02 ENCOUNTER — Other Ambulatory Visit: Payer: Self-pay | Admitting: Internal Medicine

## 2014-02-02 DIAGNOSIS — Z1231 Encounter for screening mammogram for malignant neoplasm of breast: Secondary | ICD-10-CM

## 2014-02-17 ENCOUNTER — Ambulatory Visit
Admission: RE | Admit: 2014-02-17 | Discharge: 2014-02-17 | Disposition: A | Discharge status: Home / Self Care | Payer: Medicare Other | Source: Ambulatory Visit | Attending: Internal Medicine | Admitting: Internal Medicine

## 2014-02-17 ENCOUNTER — Encounter (INDEPENDENT_AMBULATORY_CARE_PROVIDER_SITE_OTHER): Payer: Self-pay

## 2014-02-17 DIAGNOSIS — Z1231 Encounter for screening mammogram for malignant neoplasm of breast: Secondary | ICD-10-CM

## 2014-02-18 ENCOUNTER — Non-Acute Institutional Stay (SKILLED_NURSING_FACILITY): Payer: PRIVATE HEALTH INSURANCE | Admitting: Internal Medicine

## 2014-02-18 DIAGNOSIS — K5900 Constipation, unspecified: Secondary | ICD-10-CM

## 2014-02-18 DIAGNOSIS — E7800 Pure hypercholesterolemia, unspecified: Secondary | ICD-10-CM

## 2014-02-18 DIAGNOSIS — I6990 Unspecified sequelae of unspecified cerebrovascular disease: Secondary | ICD-10-CM

## 2014-02-18 DIAGNOSIS — R569 Unspecified convulsions: Secondary | ICD-10-CM

## 2014-02-18 NOTE — Progress Notes (Signed)
         PROGRESS NOTE  DATE: 02-18-14  FACILITY: Nursing Home Location: [facility]  LEVEL OF CARE: SNF (31)  Routine Visit  CHIEF COMPLAINT:  Manage Sz disorder, constipation & CVA  HISTORY OF PRESENT ILLNESS:  REASSESSMENT OF ONGOING PROBLEM(S):  CONSTIPATION: The constipation remains stable. No complications from the medications presently being used. Patient denies ongoing constipation, abdominal pain, nausea or vomiting.  SEIZURE DISORDER: The patient's seizure disorder remains stable. No complications reported from the medications presently being used. Staff do not report any recent seizure activity.  CVA: The patient's CVA remains stable.  Patient denies new neurologic symptoms such as numbness, tingling, weakness, speech difficulties or visual disturbances.  No complications reported from the medications currently being used.  PAST MEDICAL HISTORY : Reviewed.  No changes.  CURRENT MEDICATIONS: Reviewed per [facility]  REVIEW OF SYSTEMS:  GENERAL: no change in appetite, no fatigue, no weight changes, no fever, chills or weakness RESPIRATORY: no cough, SOB, DOE, wheezing, hemoptysis CARDIAC: no chest pain, edema or palpitations GI: no abdominal pain, diarrhea, constipation, heart burn, nausea or vomiting  PHYSICAL EXAMINATION  VS: See VS Section    GENERAL: no acute distress, obese body habitus EYES: nl sclerae, nl conjunctivae, no discharge NECK: supple, trachea midline, no neck masses, no thyroid tenderness, no thyromegaly LYMPHATICS: no cervical LAN, no supraclavicular LAN RESPIRATORY: breathing is even & unlabored, BS CTAB CARDIAC: RRR, no murmur,no extra heart sounds, no edema GI: abdomen soft, normal BS, no masses, no tenderness, no hepatomegaly, no splenomegaly PSYCHIATRIC: the patient is alert & oriented to person, affect & behavior appropriate  LABS/RADIOLOGY: 5-15 liver profile normal, fasting lipid panel normal 3-15 CBC normal, glucose 152  otherwise BMP normal, phenobarbital level 29.7, Dilantin level 15.5 11-14 fasting lipid panel normal, phenobarbital level 33.1, Dilantin 17.4, glucose 138 otherwise BMP normal, liver profile normal, CBC normal  6/14 FLP nl, liver profile nl, dilantin level 16.3, cbc nl, cmp nl, phenobarbital 36.6  ASSESSMENT/PLAN:  Sz disorder-stable. CVA-no new sx. Hyperlipidemia-well controlled Constipation-Colace was started GERD-stable. Essential tremor-on neurontin Allergic rhinitis-on Claritin  CPT CODE: 72620  Newton Pigg. Kerry Dory, MD [facility] [phone]

## 2014-03-31 ENCOUNTER — Non-Acute Institutional Stay (SKILLED_NURSING_FACILITY): Payer: PRIVATE HEALTH INSURANCE | Admitting: Internal Medicine

## 2014-03-31 DIAGNOSIS — E7800 Pure hypercholesterolemia, unspecified: Secondary | ICD-10-CM

## 2014-03-31 DIAGNOSIS — R569 Unspecified convulsions: Secondary | ICD-10-CM

## 2014-03-31 DIAGNOSIS — K5900 Constipation, unspecified: Secondary | ICD-10-CM

## 2014-03-31 DIAGNOSIS — I6990 Unspecified sequelae of unspecified cerebrovascular disease: Secondary | ICD-10-CM

## 2014-03-31 NOTE — Progress Notes (Signed)
         PROGRESS NOTE  DATE: 03-31-14  FACILITY: Nursing Home Location: [facility]  LEVEL OF CARE: SNF (31)  Routine Visit  CHIEF COMPLAINT:  Manage Sz disorder, constipation & CVA  HISTORY OF PRESENT ILLNESS:  REASSESSMENT OF ONGOING PROBLEM(S):  CONSTIPATION: The constipation remains stable. No complications from the medications presently being used. Patient denies ongoing constipation, abdominal pain, nausea or vomiting.  SEIZURE DISORDER: The patient's seizure disorder remains stable. No complications reported from the medications presently being used. Staff do not report any recent seizure activity.  CVA: The patient's CVA remains stable.  Patient denies new neurologic symptoms such as numbness, tingling, weakness, speech difficulties or visual disturbances.  No complications reported from the medications currently being used.  PAST MEDICAL HISTORY : Reviewed.  No changes.  [facility]  REVIEW OF SYSTEMS:  GENERAL: no change in appetite, no fatigue, no weight changes, no fever, chills or weakness RESPIRATORY: no cough, SOB, DOE, wheezing, hemoptysis CARDIAC: no chest pain, edema or palpitations GI: no abdominal pain, diarrhea, constipation, heart burn, nausea or vomiting  PHYSICAL EXAMINATION  VS: See VS Section    GENERAL: no acute distress, obese body habitus EYES: nl sclerae, nl conjunctivae, no discharge NECK: supple, trachea midline, no neck masses, no thyroid tenderness, no thyromegaly LYMPHATICS: no cervical LAN, no supraclavicular LAN RESPIRATORY: breathing is even & unlabored, BS CTAB CARDIAC: RRR, no murmur,no extra heart sounds, no edema GI: abdomen soft, normal BS, no masses, no tenderness, no hepatomegaly, no splenomegaly PSYCHIATRIC: the patient is alert & oriented to person, affect & behavior appropriate  LABS/RADIOLOGY: 7-15 phenobarbital level 28.9, Dilantin level 17.8 6-15 mammogram negative 5-15  liver profile normal, fasting lipid panel normal 3-15 CBC normal, glucose 152 otherwise BMP normal, phenobarbital level 29.7, Dilantin level 15.5 11-14 fasting lipid panel normal, phenobarbital level 33.1, Dilantin 17.4, glucose 138 otherwise BMP normal, liver profile normal, CBC normal  6/14 FLP nl, liver profile nl, dilantin level 16.3, cbc nl, cmp nl, phenobarbital 36.6  ASSESSMENT/PLAN:  Sz disorder-stable. CVA-no new sx. Hyperlipidemia-well controlled Constipation-well controlled GERD-stable. Essential tremor-on neurontin Allergic rhinitis-on Claritin Restless leg syndrome-Mirapex was started  CPT CODE: 1610999309  Lauren PiggGayani Y. Kerry Doryasanayaka, MD [facility] [phone]

## 2014-04-29 ENCOUNTER — Non-Acute Institutional Stay (SKILLED_NURSING_FACILITY): Payer: PRIVATE HEALTH INSURANCE | Admitting: Internal Medicine

## 2014-04-29 DIAGNOSIS — R569 Unspecified convulsions: Secondary | ICD-10-CM

## 2014-04-29 DIAGNOSIS — E7800 Pure hypercholesterolemia, unspecified: Secondary | ICD-10-CM

## 2014-04-29 DIAGNOSIS — I6990 Unspecified sequelae of unspecified cerebrovascular disease: Secondary | ICD-10-CM

## 2014-04-29 DIAGNOSIS — K5900 Constipation, unspecified: Secondary | ICD-10-CM

## 2014-04-30 NOTE — Progress Notes (Signed)
         PROGRESS NOTE  DATE: 04-29-14  FACILITY: Nursing Home Location: [facility]  LEVEL OF CARE: SNF (31)  Routine Visit  CHIEF COMPLAINT:  Manage Sz disorder, constipation & CVA  HISTORY OF PRESENT ILLNESS:  REASSESSMENT OF ONGOING PROBLEM(S):  CONSTIPATION: The constipation remains stable. No complications from the medications presently being used. Patient denies ongoing constipation, abdominal pain, nausea or vomiting.  SEIZURE DISORDER: The patient's seizure disorder remains stable. No complications reported from the medications presently being used. Staff do not report any recent seizure activity.  CVA: The patient's CVA remains stable.  Patient denies new neurologic symptoms such as numbness, tingling, weakness, speech difficulties or visual disturbances.  No complications reported from the medications currently being used.  PAST MEDICAL HISTORY : Reviewed.  No changes.  [facility]  REVIEW OF SYSTEMS:  GENERAL: no change in appetite, no fatigue, no weight changes, no fever, chills or weakness RESPIRATORY: no cough, SOB, DOE, wheezing, hemoptysis CARDIAC: no chest pain, edema or palpitations GI: no abdominal pain, diarrhea, constipation, heart burn, nausea or vomiting  PHYSICAL EXAMINATION  VS: See VS Section    GENERAL: no acute distress, obese body habitus EYES: nl sclerae, nl conjunctivae, no discharge NECK: supple, trachea midline, no neck masses, no thyroid tenderness, no thyromegaly LYMPHATICS: no cervical LAN, no supraclavicular LAN RESPIRATORY: breathing is even & unlabored, BS CTAB CARDIAC: RRR, no murmur,no extra heart sounds, no edema GI: abdomen soft, normal BS, no masses, no tenderness, no hepatomegaly, no splenomegaly PSYCHIATRIC: the patient is alert & oriented to person, affect & behavior appropriate  LABS/RADIOLOGY: 7-15 phenobarbital level 28.9, Dilantin level 17.8 6-15 mammogram negative 5-15  liver profile normal, fasting lipid panel normal 3-15 CBC normal, glucose 152 otherwise BMP normal, phenobarbital level 29.7, Dilantin level 15.5 11-14 fasting lipid panel normal, phenobarbital level 33.1, Dilantin 17.4, glucose 138 otherwise BMP normal, liver profile normal, CBC normal  6/14 FLP nl, liver profile nl, dilantin level 16.3, cbc nl, cmp nl, phenobarbital 36.6  ASSESSMENT/PLAN:  Sz disorder-stable. CVA-no new sx. Hyperlipidemia-well controlled Constipation-well controlled GERD-stable. Essential tremor-on neurontin Allergic rhinitis-on Claritin Restless leg syndrome-continue Mirapex Check CBC and BMP  CPT CODE: 1610999309  Newton PiggGayani Y. Kerry Doryasanayaka, MD [facility] [phone]

## 2014-05-20 ENCOUNTER — Non-Acute Institutional Stay (SKILLED_NURSING_FACILITY): Payer: PRIVATE HEALTH INSURANCE | Admitting: Internal Medicine

## 2014-05-20 DIAGNOSIS — R569 Unspecified convulsions: Secondary | ICD-10-CM

## 2014-05-20 DIAGNOSIS — K5900 Constipation, unspecified: Secondary | ICD-10-CM

## 2014-05-20 DIAGNOSIS — E7800 Pure hypercholesterolemia, unspecified: Secondary | ICD-10-CM

## 2014-05-20 DIAGNOSIS — I6990 Unspecified sequelae of unspecified cerebrovascular disease: Secondary | ICD-10-CM

## 2014-05-22 NOTE — Progress Notes (Signed)
         PROGRESS NOTE  DATE: 05-20-14  FACILITY: Nursing Home Location: [facility]  LEVEL OF CARE: SNF (31)  Routine Visit  CHIEF COMPLAINT:  Manage Sz disorder, constipation & CVA  HISTORY OF PRESENT ILLNESS:  REASSESSMENT OF ONGOING PROBLEM(S):  CONSTIPATION: The constipation remains stable. No complications from the medications presently being used. Patient denies ongoing constipation, abdominal pain, nausea or vomiting.  SEIZURE DISORDER: The patient's seizure disorder remains stable. No complications reported from the medications presently being used. Staff do not report any recent seizure activity.  CVA: The patient's CVA remains stable.  Patient denies new neurologic symptoms such as numbness, tingling, weakness, speech difficulties or visual disturbances.  No complications reported from the medications currently being used.  PAST MEDICAL HISTORY : Reviewed.  No changes.  CURRENT MEDICATIONS: Reviewed per [facility]  REVIEW OF SYSTEMS:  GENERAL: no change in appetite, no fatigue, no weight changes, no fever, chills or weakness RESPIRATORY: no cough, SOB, DOE, wheezing, hemoptysis CARDIAC: no chest pain, edema or palpitations GI: no abdominal pain, diarrhea, constipation, heart burn, nausea or vomiting  PHYSICAL EXAMINATION  VS: See VS Section    GENERAL: no acute distress, obese body habitus EYES: nl sclerae, nl conjunctivae, no discharge NECK: supple, trachea midline, no neck masses, no thyroid tenderness, no thyromegaly LYMPHATICS: no cervical LAN, no supraclavicular LAN RESPIRATORY: breathing is even & unlabored, BS CTAB CARDIAC: RRR, no murmur,no extra heart sounds, no edema GI: abdomen soft, normal BS, no masses, no tenderness, no hepatomegaly, no splenomegaly PSYCHIATRIC: the patient is alert & oriented to person, affect & behavior appropriate  LABS/RADIOLOGY: 8-15 CBC normal, BMP normal 7-15 phenobarbital level 28.9, Dilantin level 17.8 6-15  mammogram negative 5-15 liver profile normal, fasting lipid panel normal 3-15 CBC normal, glucose 152 otherwise BMP normal, phenobarbital level 29.7, Dilantin level 15.5 11-14 fasting lipid panel normal, phenobarbital level 33.1, Dilantin 17.4, glucose 138 otherwise BMP normal, liver profile normal, CBC normal  6/14 FLP nl, liver profile nl, dilantin level 16.3, cbc nl, cmp nl, phenobarbital 36.6  ASSESSMENT/PLAN:  Sz disorder-stable. CVA-no new sx. Hyperlipidemia-well controlled Constipation-well controlled GERD-stable. Essential tremor-on neurontin Allergic rhinitis-on Claritin Restless leg syndrome-continue Mirapex Neuropathy-continue Neurontin  CPT CODE: 16109  Newton Pigg. Kerry Dory, MD [facility] [phone]

## 2014-06-11 ENCOUNTER — Other Ambulatory Visit: Payer: Self-pay | Admitting: *Deleted

## 2014-06-11 MED ORDER — PHENOBARBITAL 64.8 MG PO TABS: ORAL_TABLET | ORAL | Status: DC

## 2014-06-11 NOTE — Telephone Encounter (Signed)
[facility] 

## 2014-06-17 ENCOUNTER — Non-Acute Institutional Stay (SKILLED_NURSING_FACILITY): Payer: PRIVATE HEALTH INSURANCE | Admitting: Internal Medicine

## 2014-06-17 DIAGNOSIS — E7800 Pure hypercholesterolemia, unspecified: Secondary | ICD-10-CM

## 2014-06-17 DIAGNOSIS — R569 Unspecified convulsions: Secondary | ICD-10-CM

## 2014-06-17 DIAGNOSIS — K5900 Constipation, unspecified: Secondary | ICD-10-CM

## 2014-06-17 DIAGNOSIS — I6990 Unspecified sequelae of unspecified cerebrovascular disease: Secondary | ICD-10-CM

## 2014-06-19 NOTE — Progress Notes (Signed)
         PROGRESS NOTE  DATE: 06-17-14  FACILITY: Nursing Home Location: [facility]  LEVEL OF CARE: SNF (31)  Routine Visit  CHIEF COMPLAINT:  Manage Sz disorder, constipation & CVA  HISTORY OF PRESENT ILLNESS:  REASSESSMENT OF ONGOING PROBLEM(S):  CONSTIPATION: The constipation remains stable. No complications from the medications presently being used. Patient denies ongoing constipation, abdominal pain, nausea or vomiting.  SEIZURE DISORDER: The patient's seizure disorder remains stable. No complications reported from the medications presently being used. Staff do not report any recent seizure activity.  CVA: The patient's CVA remains stable.  Patient denies new neurologic symptoms such as numbness, tingling, weakness, speech difficulties or visual disturbances.  No complications reported from the medications currently being used.  PAST MEDICAL HISTORY : Reviewed.  No changes.  [facility] MED[facility]S: [facility]  REVIEW OF SYSTEMS:  GENERAL: no change in appetite, no fatigue, no weight changes, no fever, chills or weakness RESPIRATORY: no cough, SOB, DOE, wheezing, hemoptysis CARDIAC: no chest pain, edema or palpitations GI: no abdominal pain, diarrhea, constipation, heart burn, nausea or vomiting  PHYSICAL EXAMINATION  VS: See VS Section    GENERAL: no acute distress, obese body habitus EYES: nl sclerae, nl conjunctivae, no discharge NECK: supple, trachea midline, no neck masses, no thyroid tenderness, no thyromegaly LYMPHATICS: no cervical LAN, no supraclavicular LAN RESPIRATORY: breathing is even & unlabored, BS CTAB CARDIAC: RRR, no murmur,no extra heart sounds, no edema GI: abdomen soft, normal BS, no masses, no tenderness, no hepatomegaly, no splenomegaly PSYCHIATRIC: the patient is alert & oriented to person, affect & behavior appropriate  LABS/RADIOLOGY: 8-15 CBC normal, BMP normal 7-15 phenobarbital level 28.9, Dilantin level 17.8 6-15  mammogram negative 5-15 liver profile normal, fasting lipid panel normal 3-15 CBC normal, glucose 152 otherwise BMP normal, phenobarbital level 29.7, Dilantin level 15.5 11-14 fasting lipid panel normal, phenobarbital level 33.1, Dilantin 17.4, glucose 138 otherwise BMP normal, liver profile normal, CBC normal  6/14 FLP nl, liver profile nl, dilantin level 16.3, cbc nl, cmp nl, phenobarbital 36.6  ASSESSMENT/PLAN:  Sz disorder-stable. CVA-no new sx. Hyperlipidemia-well controlled Constipation-well controlled GERD-stable. Essential tremor-on neurontin Allergic rhinitis-on Claritin Restless leg syndrome-continue Mirapex Neuropathy-continue Neurontin  CPT CODE: 6644099309  Newton PiggGayani Y. Kerry Doryasanayaka, MD [facility] [phone]

## 2014-07-05 ENCOUNTER — Other Ambulatory Visit: Payer: Self-pay | Admitting: *Deleted

## 2014-07-05 MED ORDER — PHENOBARBITAL 64.8 MG PO TABS: ORAL_TABLET | ORAL | Status: DC

## 2014-07-05 NOTE — Telephone Encounter (Signed)
[facility] 

## 2014-08-16 ENCOUNTER — Non-Acute Institutional Stay (SKILLED_NURSING_FACILITY): Payer: PRIVATE HEALTH INSURANCE | Admitting: Internal Medicine

## 2014-08-16 ENCOUNTER — Other Ambulatory Visit: Payer: Self-pay | Admitting: *Deleted

## 2014-08-16 DIAGNOSIS — K5900 Constipation, unspecified: Secondary | ICD-10-CM

## 2014-08-16 DIAGNOSIS — I639 Cerebral infarction, unspecified: Secondary | ICD-10-CM

## 2014-08-16 DIAGNOSIS — G250 Essential tremor: Secondary | ICD-10-CM

## 2014-08-16 DIAGNOSIS — G40909 Epilepsy, unspecified, not intractable, without status epilepticus: Secondary | ICD-10-CM

## 2014-08-16 DIAGNOSIS — E785 Hyperlipidemia, unspecified: Secondary | ICD-10-CM | POA: Insufficient documentation

## 2014-08-16 MED ORDER — PHENOBARBITAL 32.4 MG PO TABS: ORAL_TABLET | ORAL | Status: DC

## 2014-08-16 NOTE — Progress Notes (Signed)
Patient ID: Lauren Burch, female   DOB: Jun 20, 1936, 78 y.o.   MRN: [id number]    [facility]  Chief Complaint  Patient presents with  . Medical Management of Chronic Issues   Allergies  Allergen Reactions  . Lasix [Furosemide]   . Latex    HPI 78 y/o female patient is seen today for routine visit. She is seen in her room. She appears comfortable and denies any concerns. No new concern from staff, no falls reported, no acute behavioral or skin concerns.  She has pmh of gerd, CVA, constipation, seizures, HLD  Review of Systems  Constitutional: Negative for fever, chills, malaise/fatigue and diaphoresis.  HENT: Negative for congestion, hearing loss and sore throat.   Eyes: Negative for blurred vision, double vision and discharge.  Respiratory: Negative for cough, sputum production, shortness of breath and wheezing.   Cardiovascular: Negative for chest pain, palpitations, orthopnea and leg swelling.  Gastrointestinal: Negative for heartburn, nausea, vomiting, abdominal pain, diarrhea Genitourinary: Negative for dysuria  Musculoskeletal: Negative for back pain, falls. Uses a walker Skin: Negative for itching and rash.  Neurological: Negative for dizziness, tingling, focal weakness and headaches.  Psychiatric/Behavioral: Negative for depression   Past Medical History  Diagnosis Date  . CVA (cerebral infarction)   . TBI (traumatic brain injury)     Age 78 - Seizures  . Seizures      Medication List       This list is accurate as of: 08/16/14  6:07 PM.  Always use your most recent med list.               atorvastatin 10 MG tablet  Commonly known as:  LIPITOR  Take 10 mg by mouth at bedtime.     CALTRATE 600+D 600-400 MG-UNIT per tablet  Generic drug:  Calcium Carbonate-Vitamin D  Take 1 tablet by mouth 2 (two) times daily. For osteoporosis     clopidogrel 75 MG tablet  Commonly known as:  PLAVIX  Take 75 mg by mouth daily with  breakfast. For CVA     docusate sodium 100 MG capsule  Commonly known as:  COLACE  Take 50 mg by mouth daily. For constipation     gabapentin 100 MG capsule  Commonly known as:  NEURONTIN  Take 100 mg by mouth every morning. For tremors     gabapentin 300 MG capsule  Commonly known as:  NEURONTIN  Take 400 mg by mouth at bedtime.     loratadine 10 MG tablet  Commonly known as:  CLARITIN  Take 10 mg by mouth daily. For allergies     nitroGLYCERIN 0.4 MG SL tablet  Commonly known as:  NITROSTAT  Place 0.4 mg under the tongue every 5 (five) minutes as needed for chest pain (repeat for 3 doses prn).     pantoprazole 20 MG tablet  Commonly known as:  PROTONIX  Take 20 mg by mouth daily.     PHENobarbital 64.8 MG tablet  Commonly known as:  LUMINAL  Take one tablet by mouth twice daily for seizures     PHENobarbital 32.4 MG tablet  Commonly known as:  LUMINAL  Take one tablet by mouth every night at bedtime     phenytoin 100 MG ER capsule  Commonly known as:  DILANTIN  Take 200 mg by mouth 2 (two) times daily. For seizures     polyethylene glycol packet  Commonly known as:  MIRALAX / GLYCOLAX  Take 17 g  by mouth daily. Mix 17 gm with 4-8 oz of liquid daily for constipation.     sennosides-docusate sodium 8.6-50 MG tablet  Commonly known as:  SENOKOT-S  Take 2 tablets by mouth daily.     Vitamin D-3 1000 UNITS Caps  Take 1 capsule by mouth daily. For osteoporosis       Physical exam BP 125/68 mmHg  Pulse 66  Temp(Src) 97.1 F (36.2 C)  Resp 16  SpO2 95%  General- elderly female in no acute distress Head- atraumatic, normocephalic Eyes- PERRLA, EOMI, no pallor, no icterus, no discharge Neck- no lymphadenopathy Mouth- normal mucus membrane Cardiovascular- normal s1,s2, no murmurs, normal distal pulses Respiratory- bilateral clear to auscultation, no wheeze, no rhonchi, no crackles Abdomen- bowel sounds present, soft, non tender Musculoskeletal- able to move  all 4 extremities, no leg edema Neurological- no focal deficit Skin- warm and dry Psychiatry- alert and oriented to person, place and time, normal mood and affect  Labs- 07/27/14 t.chol 164, hdl 62, ldl 78, tg 119, wbc 4.5, hb 11.6, hct 35.3, mcv 91.5, plt 200, na 134, k 4.5, bun 12, cr 0.4  Assessment/plan  HLD Stable, continue lipitor given her hx of cva  Seizure disorder Remains seizure free. Continue phenytoin and phenobarbital.   Constipation Stable with miralax and senna s. On a small dose of colace, continue for now  Essential tremor On neurontin and mentions it being helpful. Continue this  Old cva Stable neurologically, continue statin and plavix, bp controlled.

## 2014-08-16 NOTE — Telephone Encounter (Signed)
[facility] 

## 2014-10-05 ENCOUNTER — Other Ambulatory Visit: Payer: Self-pay | Admitting: *Deleted

## 2014-10-05 MED ORDER — PHENOBARBITAL 64.8 MG PO TABS: ORAL_TABLET | ORAL | Status: DC

## 2014-10-05 NOTE — Telephone Encounter (Signed)
[facility] 

## 2014-10-06 ENCOUNTER — Non-Acute Institutional Stay (SKILLED_NURSING_FACILITY): Payer: Medicare Other | Admitting: Internal Medicine

## 2014-10-06 DIAGNOSIS — M6289 Other specified disorders of muscle: Secondary | ICD-10-CM

## 2014-10-06 DIAGNOSIS — J069 Acute upper respiratory infection, unspecified: Secondary | ICD-10-CM

## 2014-10-06 DIAGNOSIS — R0981 Nasal congestion: Secondary | ICD-10-CM

## 2014-10-06 DIAGNOSIS — R531 Weakness: Secondary | ICD-10-CM

## 2014-10-06 NOTE — Progress Notes (Signed)
Patient ID: Lauren Burch, female   DOB: 03/26/1936, 79 y.o.   MRN: [id number]    [facility]  Chief complaint: medical management of chronic illness  Allergies: lasix, latex  Code status: DNR  HPI 79 y/o female patient is seen today for routine visit. She has been having cough with runny nose x 1 day. She feels stuffed up in her nose and has clear nasal discharge. She complaints of pain underneath her left eye for 3 days with feeling of stuffiness. Denies fever or chills. Denies any shortness of breath. She has pmh of CVA, constipation, seizures, HLD, GERD  Review of Systems   Constitutional: Negative for fever, chills, diaphoresis.   HENT: Negative for sore throat.    Eyes: Negative for blurred vision, double vision and discharge.   Respiratory: Negative for cshortness of breath and wheezing.    Cardiovascular: Negative for chest pain, palpitations, orthopnea and leg swelling.   Gastrointestinal: Negative for heartburn, nausea, vomiting, abdominal pain, diarrhea Genitourinary: Negative for dysuria   Musculoskeletal: Negative for falls.  Skin: Negative for itching and rash.     PAST MEDICAL HISTORY reviewed  [facility]ication [facility]. [facility]   Physical exam BP 141/83 mmHg  Pulse 82  Temp(Src) 99.1 F (37.3 C)  Resp 18  SpO2 98%  General- elderly female, obese, in no acute distress Head- atraumatic, normocephalic Eyes- PERRLA, EOMI, no pallor, no icterus, no discharge Neck- no lymphadenopathy Nose- no maxillary or frontal sinus tenderness, clear nasal discharge Mouth- normal mucus membrane Cardiovascular- normal s1,s2, no murmurs, normal distal pulses Respiratory- bilateral clear to auscultation, no wheeze, no rhonchi, no crackles Abdomen- bowel sounds present, soft, non tender Musculoskeletal- able to move all 4 extremities, no leg edema, left sided weakness Skin- warm and dry Psychiatry- alert and oriented   Labs- 07/27/14  t.chol 164, hdl 62, ldl 78, tg 119, wbc 4.5, hb 11.6, hct 35.3, mcv 91.5, plt 200, na 134, k 4.5, bun 12, cr 0.4   Assessment/plan  Nasal congestion Start claritin 10 mg daily x 5 days, then daily prn congestion and reassess  Viral URI Start robitussin for cough. claritin started. Monitor clinically, if no improvement or symptom worsens, assess for bacterial uri vs bronchitis. Hydration and rest encouraged.   Old cva With left sided weakness. continue plavix and lipitor

## 2014-10-20 ENCOUNTER — Other Ambulatory Visit: Payer: Self-pay | Admitting: *Deleted

## 2014-10-20 MED ORDER — TEMAZEPAM 15 MG PO CAPS: 15.0000 mg | ORAL_CAPSULE | Freq: Every evening | ORAL | Status: DC | PRN

## 2014-10-20 NOTE — Telephone Encounter (Signed)
[facility] 

## 2014-11-23 ENCOUNTER — Other Ambulatory Visit: Payer: Self-pay | Admitting: *Deleted

## 2014-11-23 MED ORDER — PHENOBARBITAL 32.4 MG PO TABS: ORAL_TABLET | ORAL | Status: DC

## 2014-11-23 NOTE — Telephone Encounter (Signed)
[facility] # [phone] Fax: [phone] 

## 2014-11-26 LAB — HEPATIC FUNCTION PANEL
ALT: 27 U/L (ref 7–35)
AST: 24 U/L (ref 13–35)
Alkaline Phosphatase: 97 U/L (ref 25–125)
Bilirubin, Total: 0.2 mg/dL

## 2014-11-26 LAB — BASIC METABOLIC PANEL
BUN: 13 mg/dL (ref 4–21)
CREATININE: 0.5 mg/dL (ref <=1.1)
Glucose: 105 mg/dL
POTASSIUM: 4.2 mmol/L (ref 3.4–5.3)
SODIUM: 137 mmol/L (ref 137–147)

## 2014-12-07 LAB — CBC AND DIFFERENTIAL
HCT: 35 % — AB (ref 36–46)
HEMOGLOBIN: 11.6 g/dL — AB (ref 12.0–16.0)
Platelets: 189 10*3/uL (ref 150–399)
WBC: 3.8 10^3/mL

## 2014-12-09 ENCOUNTER — Non-Acute Institutional Stay (SKILLED_NURSING_FACILITY): Payer: Medicare Other | Admitting: Internal Medicine

## 2014-12-09 ENCOUNTER — Encounter: Payer: Self-pay | Admitting: Internal Medicine

## 2014-12-09 DIAGNOSIS — I6990 Unspecified sequelae of unspecified cerebrovascular disease: Secondary | ICD-10-CM

## 2014-12-09 DIAGNOSIS — E039 Hypothyroidism, unspecified: Secondary | ICD-10-CM | POA: Diagnosis not present

## 2014-12-09 DIAGNOSIS — E785 Hyperlipidemia, unspecified: Secondary | ICD-10-CM | POA: Diagnosis not present

## 2014-12-09 DIAGNOSIS — D519 Vitamin B12 deficiency anemia, unspecified: Secondary | ICD-10-CM | POA: Diagnosis not present

## 2014-12-09 DIAGNOSIS — G40909 Epilepsy, unspecified, not intractable, without status epilepticus: Secondary | ICD-10-CM | POA: Diagnosis not present

## 2014-12-09 NOTE — Progress Notes (Signed)
Patient ID: Lauren Burch, female   DOB: 03/25/1936, 79 y.o.   MRN: [id number]    [facility]: [facility]  Chief complaint: medical management of chronic illness  Allergies: lasix, latex  Code status: DNR  HPI 79 y/o female patient is seen today for routine visit. She is a long term resident here. She has been started on b12 for b12 deficiency and levothyroxine for hypothyroidism. She has completed ciprofloxacin for UTI. She appears comfortable and denies any concerns. No new concern from staff, no falls reported, no acute behavioral or skin concerns.  she is pending gyn appointment on 12/13/14 for vaginal bleeding She has pmh of CVA, constipation, seizures, HLD, GERD  Review of Systems  Constitutional: Negative for fever, chills, diaphoresis.  HENT: Negative for congestion, hearing loss and sore throat.   Eyes: Negative for blurred vision, double vision and discharge.  Respiratory: Negative for cough, sputum production, shortness of breath and wheezing.   Cardiovascular: Negative for chest pain, palpitations, orthopnea and leg swelling.  Gastrointestinal: Negative for heartburn, nausea, vomiting, abdominal pain, diarrhea Genitourinary: Negative for dysuria  Musculoskeletal: Negative for back pain, falls. Uses a walker Skin: Negative for itching and rash.  Neurological: Negative for dizziness, tingling, focal weakness and headaches.  Psychiatric/Behavioral: Negative for depression   PAST MEDICAL HISTORY reviewed    Medication List       This list is accurate as of: 12/09/14  4:05 PM.  Always use your most recent med list.               atorvastatin 10 MG tablet  Commonly known as:  LIPITOR  Take 10 mg by mouth at bedtime.     CALTRATE 600+D 600-400 MG-UNIT per tablet  Generic drug:  Calcium Carbonate-Vitamin D  Take 1 tablet by mouth 2 (two) times daily. For osteoporosis     docusate sodium 100 MG capsule  Commonly known as:  COLACE  Take  50 mg by mouth daily. For constipation     gabapentin 100 MG capsule  Commonly known as:  NEURONTIN  Take 100 mg by mouth every morning. For tremors     gabapentin 300 MG capsule  Commonly known as:  NEURONTIN  Take 400 mg by mouth at bedtime.     levothyroxine 25 MCG tablet  Commonly known as:  SYNTHROID, LEVOTHROID  Take 25 mcg by mouth daily before breakfast.     loratadine 10 MG tablet  Commonly known as:  CLARITIN  Take 10 mg by mouth daily as needed. For allergies     nitroGLYCERIN 0.4 MG SL tablet  Commonly known as:  NITROSTAT  Place 0.4 mg under the tongue every 5 (five) minutes as needed for chest pain (repeat for 3 doses prn).     PHENobarbital 64.8 MG tablet  Commonly known as:  LUMINAL  Take one tablet by mouth twice daily for seizures     PHENobarbital 32.4 MG tablet  Commonly known as:  LUMINAL  Take one tablet by mouth every night at bedtime     phenytoin 100 MG ER capsule  Commonly known as:  DILANTIN  Take 200 mg by mouth 2 (two) times daily. For seizures     polyethylene glycol packet  Commonly known as:  MIRALAX / GLYCOLAX  Take 17 g by mouth daily. Mix 17 gm with 4-8 oz of liquid daily for constipation.     sennosides-docusate sodium 8.6-50 MG tablet  Commonly known as:  SENOKOT-S  Take 2  tablets by mouth daily.     vitamin B-12 500 MCG tablet  Commonly known as:  CYANOCOBALAMIN  Take 500 mcg by mouth daily.       Physical exam BP 123/64 mmHg  Pulse 79  Temp(Src) 98 F (36.7 C)  Resp 18  SpO2 91%  General- elderly female, obese, in no acute distress Head- atraumatic, normocephalic Eyes- PERRLA, EOMI, no pallor, no icterus, no discharge Neck- no lymphadenopathy Mouth- normal mucus membrane Cardiovascular- normal s1,s2, no murmurs, normal distal pulses Respiratory- bilateral clear to auscultation, no wheeze, no rhonchi, no crackles Abdomen- bowel sounds present, soft, non tender Musculoskeletal- able to move all 4 extremities, no leg  edema, left sided weakness, uses a walker Neurological- no focal deficit Skin- warm and dry Psychiatry- alert and oriented to person, place and time, normal mood and affect  Labs- 07/27/14 t.chol 164, hdl 62, ldl 78, tg 119, wbc 4.5, hb 11.6, hct 35.3, mcv 91.5, plt 200, na 134, k 4.5, bun 12, cr 0.4 11/26/14 b12 254, wbc 4, hb 11.6, hct 34.3, plt 237, na 137, k 4.2, glu 105, bun 13, cr 0.52, lft wnl, tsh 5.587, phenytoin 19, phenobarbital 39.2  Assessment/plan  Hypothyroidism Continue levothyroxine 25 mcg daily. Monitor tsh  b12 deficiency anemia Continue b12 supplement  Seizures No recent seizure, continue dilantin and phenobarbital, levels reviewed  HLD Stable, continue lipitor 10 mg daily given her hx of cva  CVA with left sided weakness Stable neurologically, continue statin and plavix, bp controlled.

## 2014-12-09 NOTE — Progress Notes (Signed)
This encounter was created in error - please disregard.

## 2014-12-28 LAB — CBC AND DIFFERENTIAL
HCT: 33 % — AB (ref 36–46)
Hemoglobin: 11 g/dL — AB (ref 12.0–16.0)
Platelets: 197 10*3/uL (ref 150–399)
WBC: 3.9 10^3/mL

## 2014-12-28 LAB — TSH: TSH: 3.53 u[IU]/mL (ref 0.41–5.90)

## 2015-01-04 ENCOUNTER — Non-Acute Institutional Stay (SKILLED_NURSING_FACILITY): Payer: Medicare Other | Admitting: Internal Medicine

## 2015-01-04 DIAGNOSIS — J309 Allergic rhinitis, unspecified: Secondary | ICD-10-CM | POA: Diagnosis not present

## 2015-01-04 DIAGNOSIS — D638 Anemia in other chronic diseases classified elsewhere: Secondary | ICD-10-CM | POA: Diagnosis not present

## 2015-01-04 DIAGNOSIS — E039 Hypothyroidism, unspecified: Secondary | ICD-10-CM | POA: Diagnosis not present

## 2015-01-04 NOTE — Progress Notes (Signed)
Patient ID: Lauren Burch, female   DOB: 26-Jun-1936, 79 y.o.   MRN: [id number]    [facility]: [facility]  Chief complaint: medical management of chronic illness  Allergies: lasix, latex  Code status: DNR  HPI 79 y/o female patient is seen today for routine visit. She has been working with [facility] and using a walker to get around the facility. She denies any concerns this visit. She has pmh of CVA, constipation, seizures, HLD, GERD  Review of Systems   Constitutional: Negative for fever, chills, diaphoresis.   HENT: Negative for congestion, sore throat.    Eyes: Negative for blurred vision, double vision and discharge.   Respiratory: Negative for cough, sputum production, shortness of breath and wheezing.    Cardiovascular: Negative for chest pain, palpitations, orthopnea and leg swelling.   Gastrointestinal: Negative for heartburn, nausea, vomiting, abdominal pain Genitourinary: Negative for dysuria   Musculoskeletal: Negative for back pain, falls. Uses a walker Skin: Negative for itching and rash.   Neurological: Negative for dizziness, headaches.   Psychiatric/Behavioral: Negative for depression   PAST MEDICAL HISTORY reviewed    Medication List       This list is accurate as of: 01/04/15  3:55 PM.  Always use your most recent med list.               atorvastatin 10 MG tablet  Commonly known as:  LIPITOR  Take 10 mg by mouth at bedtime.     CALTRATE 600+D 600-400 MG-UNIT per tablet  Generic drug:  Calcium Carbonate-Vitamin D  Take 1 tablet by mouth 2 (two) times daily. For osteoporosis     cholecalciferol 1000 UNITS tablet  Commonly known as:  VITAMIN D  Take 1,000 Units by mouth daily. Take 1 tablet by mouth daily for senile osteoporosis     clopidogrel 75 MG tablet  Commonly known as:  PLAVIX  Take 75 mg by mouth daily. Take 1 tablet by mouth daily for CVA     docusate sodium 50 MG capsule  Commonly known as:  COLACE    Take 50 mg by mouth daily.     gabapentin 400 MG capsule  Commonly known as:  NEURONTIN  Take 400 mg by mouth. Take 1 capsule  =400 mg by mouth every night at bedtime for tremors     gabapentin 100 MG capsule  Commonly known as:  NEURONTIN  Take 100 mg by mouth every morning. For tremors     levothyroxine 25 MCG tablet  Commonly known as:  SYNTHROID, LEVOTHROID  Take 25 mcg by mouth daily before breakfast.     loratadine 10 MG tablet  Commonly known as:  CLARITIN  Take 10 mg by mouth daily as needed. For allergies     nitroGLYCERIN 0.4 MG SL tablet  Commonly known as:  NITROSTAT  Place 0.4 mg under the tongue every 5 (five) minutes as needed for chest pain (repeat for 3 doses prn).     pantoprazole 20 MG tablet  Commonly known as:  PROTONIX  Take 20 mg by mouth daily.     PHENobarbital 64.8 MG tablet  Commonly known as:  LUMINAL  Take one tablet by mouth twice daily for seizures     PHENobarbital 32.4 MG tablet  Commonly known as:  LUMINAL  Take one tablet by mouth every night at bedtime     phenytoin 100 MG ER capsule  Commonly known as:  DILANTIN  Take 200 mg by mouth 2 (  two) times daily. For seizures     Polyethyl Glycol-Propyl Glycol 0.4-0.3 % Soln  Apply to eye. Instill 1 drop each eye twice  Daily for dry eyes wait 3-5 minutes between 2 eye meds.     polyethylene glycol packet  Commonly known as:  MIRALAX / GLYCOLAX  Take 17 g by mouth daily. Mix 17 gm with 4-8 oz of liquid daily for constipation.     sennosides-docusate sodium 8.6-50 MG tablet  Commonly known as:  SENOKOT-S  Take 2 tablets by mouth daily.     vitamin B-12 500 MCG tablet  Commonly known as:  CYANOCOBALAMIN  Take 500 mcg by mouth daily.        Physical exam BP 118/78 mmHg  Pulse 85  Temp(Src) 97 F (36.1 C)  Resp 18  Wt 186 lb 3.2 oz (84.46 kg)  SpO2 96%  Wt Readings from Last 3 Encounters:  01/04/15 186 lb 3.2 oz (84.46 kg)  05/01/13 198 lb 6.4 oz (89.994 kg)    General-  elderly female, obese, in no acute distress Head- atraumatic, normocephalic Eyes- PERRLA, EOMI, no pallor, no icterus, no discharge Neck- no lymphadenopathy Mouth- normal mucus membrane Cardiovascular- normal s1,s2, no murmurs, normal distal pulses Respiratory- bilateral clear to auscultation, no wheeze, no rhonchi, no crackles Abdomen- bowel sounds present, soft, non tender Musculoskeletal- able to move all 4 extremities, no leg edema, left sided weakness, uses a walker Neurological- no focal deficit Skin- warm and dry Psychiatry- alert and oriented to person, place and time, normal mood and affect  Labs- 07/27/14 t.chol 164, hdl 62, ldl 78, tg 119, wbc 4.5, hb 11.6, hct 35.3, mcv 91.5, plt 200, na 134, k 4.5, bun 12, cr 0.4 11/26/14 b12 254, wbc 4, hb 11.6, hct 34.3, plt 237, na 137, k 4.2, glu 105, bun 13, cr 0.52, lft wnl, tsh 5.587, phenytoin 19, phenobarbital 39.2 12/28/14 wbc 3.9, hb 11, hct 33, tsh 3.527  Assessment/plan  Anemia  mild anemia, her b12 deficiency and hypothyroidism could be contributing some. Monitor clinically. continuw b12 supplement and thyroid medication  Hypothyroidism Reviewed tsh, improved from 5.5 to 3.5. Continue levothyroxine 25 mcg daily. Monitor tsh every 3 month  Allergic rhinitis Stable on claritin 10 mg daily prn

## 2015-01-25 LAB — HEPATIC FUNCTION PANEL
ALT: 25 U/L (ref 7–35)
AST: 21 U/L (ref 13–35)
Alkaline Phosphatase: 82 U/L (ref 25–125)
Bilirubin, Total: 0.3 mg/dL

## 2015-01-25 LAB — CBC AND DIFFERENTIAL
HCT: 35 % — AB (ref 36–46)
Hemoglobin: 11.9 g/dL — AB (ref 12.0–16.0)
Platelets: 214 10*3/uL (ref 150–399)
WBC: 4.4 10^3/mL

## 2015-01-25 LAB — BASIC METABOLIC PANEL
BUN: 12 mg/dL (ref 4–21)
Creatinine: 0.5 mg/dL (ref 0.5–1.1)
GLUCOSE: 131 mg/dL
Potassium: 4.3 mmol/L (ref 3.4–5.3)
SODIUM: 137 mmol/L (ref 137–147)

## 2015-01-27 ENCOUNTER — Non-Acute Institutional Stay (SKILLED_NURSING_FACILITY): Payer: Medicare Other | Admitting: Internal Medicine

## 2015-01-27 DIAGNOSIS — N760 Acute vaginitis: Secondary | ICD-10-CM

## 2015-01-27 DIAGNOSIS — E559 Vitamin D deficiency, unspecified: Secondary | ICD-10-CM

## 2015-01-27 DIAGNOSIS — K219 Gastro-esophageal reflux disease without esophagitis: Secondary | ICD-10-CM | POA: Diagnosis not present

## 2015-01-27 DIAGNOSIS — I6990 Unspecified sequelae of unspecified cerebrovascular disease: Secondary | ICD-10-CM

## 2015-01-27 NOTE — Progress Notes (Signed)
Patient ID: Lauren Burch, female   DOB: 1936/04/22, 79 y.o.   MRN: [id number]     [facility]: [facility]  Chief complaint: medical management of chronic illness  Allergies: lasix, latex  Code status: DNR  HPI 79 y/o female patient is seen today for routine visit. She denies any concerns this visit. She has pmh of CVA, constipation, seizures, HLD, GERD. No new concerns from staff.  Review of Systems   Constitutional: Negative for fever, chills, diaphoresis.   HENT: Negative for congestion, sore throat.    Eyes: Negative for blurred vision, double vision and discharge.   Respiratory: Negative for cough, sputum production, shortness of breath and wheezing.    Cardiovascular: Negative for chest pain, palpitations, orthopnea and leg swelling.   Gastrointestinal: Negative for heartburn, nausea, vomiting, abdominal pain Genitourinary: Negative for dysuria   Musculoskeletal: Negative for back pain, falls. Uses a walker Skin: Negative for itching and rash.   Neurological: Negative for dizziness, headaches.   Psychiatric/Behavioral: Negative for depression   PAST MEDICAL HISTORY reviewed    Medication List       This list is accurate as of: 01/27/15  5:40 PM.  Always use your most recent med list.               atorvastatin 10 MG tablet  Commonly known as:  LIPITOR  Take 10 mg by mouth at bedtime.     CALTRATE 600+D 600-400 MG-UNIT per tablet  Generic drug:  Calcium Carbonate-Vitamin D  Take 1 tablet by mouth 2 (two) times daily. For osteoporosis     cholecalciferol 1000 UNITS tablet  Commonly known as:  VITAMIN D  Take 1,000 Units by mouth daily. Take 1 tablet by mouth daily for senile osteoporosis     clopidogrel 75 MG tablet  Commonly known as:  PLAVIX  Take 75 mg by mouth daily. Take 1 tablet by mouth daily for CVA     docusate sodium 50 MG capsule  Commonly known as:  COLACE  Take 50 mg by mouth daily.     gabapentin 400 MG  capsule  Commonly known as:  NEURONTIN  Take 400 mg by mouth. Take 1 capsule  =400 mg by mouth every night at bedtime for tremors     gabapentin 100 MG capsule  Commonly known as:  NEURONTIN  Take 100 mg by mouth every morning. For tremors     levothyroxine 25 MCG tablet  Commonly known as:  SYNTHROID, LEVOTHROID  Take 25 mcg by mouth daily before breakfast.     loratadine 10 MG tablet  Commonly known as:  CLARITIN  Take 10 mg by mouth daily as needed. For allergies     nitroGLYCERIN 0.4 MG SL tablet  Commonly known as:  NITROSTAT  Place 0.4 mg under the tongue every 5 (five) minutes as needed for chest pain (repeat for 3 doses prn).     pantoprazole 20 MG tablet  Commonly known as:  PROTONIX  Take 20 mg by mouth daily.     PHENobarbital 64.8 MG tablet  Commonly known as:  LUMINAL  Take one tablet by mouth twice daily for seizures     PHENobarbital 32.4 MG tablet  Commonly known as:  LUMINAL  Take one tablet by mouth every night at bedtime     phenytoin 100 MG ER capsule  Commonly known as:  DILANTIN  Take 200 mg by mouth 2 (two) times daily. For seizures     Polyethyl  Glycol-Propyl Glycol 0.4-0.3 % Soln  Apply to eye. Instill 1 drop each eye twice  Daily for dry eyes wait 3-5 minutes between 2 eye meds.     polyethylene glycol packet  Commonly known as:  MIRALAX / GLYCOLAX  Take 17 g by mouth daily. Mix 17 gm with 4-8 oz of liquid daily for constipation.     sennosides-docusate sodium 8.6-50 MG tablet  Commonly known as:  SENOKOT-S  Take 2 tablets by mouth daily.     vitamin B-12 500 MCG tablet  Commonly known as:  CYANOCOBALAMIN  Take 500 mcg by mouth daily.        Physical exam BP 108/64 mmHg  Pulse 76  Temp(Src) 97 F (36.1 C)  Resp 18  Wt 184 lb 6.4 oz (83.643 kg)  SpO2 99%  Wt Readings from Last 3 Encounters:  01/27/15 184 lb 6.4 oz (83.643 kg)  01/04/15 186 lb 3.2 oz (84.46 kg)  05/01/13 198 lb 6.4 oz (89.994 kg)    General- elderly female,  obese, in no acute distress Head- atraumatic, normocephalic Eyes- PERRLA, EOMI, no pallor, no icterus, no discharge Neck- no lymphadenopathy Mouth- normal mucus membrane Cardiovascular- normal s1,s2, no murmurs, normal distal pulses Respiratory- bilateral clear to auscultation, no wheeze, no rhonchi, no crackles Abdomen- bowel sounds present, soft, non tender Musculoskeletal- able to move all 4 extremities, no leg edema, left sided weakness, uses a walker Neurological- no focal deficit Psychiatry- alert and oriented to person, place and time, normal mood and affect  Labs- 07/27/14 t.chol 164, hdl 62, ldl 78, tg 119, wbc 4.5, hb 11.6, hct 35.3, mcv 91.5, plt 200, na 134, k 4.5, bun 12, cr 0.4 11/26/14 b12 254, wbc 4, hb 11.6, hct 34.3, plt 237, na 137, k 4.2, glu 105, bun 13, cr 0.52, lft wnl, tsh 5.587, phenytoin 19, phenobarbital 39.2 12/28/14 wbc 3.9, hb 11, hct 33, tsh 3.527  Assessment/plan  Old cva Stable, continue plavix 75 mg daily and lipitor 10 mg daily  gerd Stable, continue protonix 20 mg daily  Vitamin d def Continue vitamin d3 1000 u daily  With oscal-d  Vulvovaginitis Recurrent, on diflucan weekly for this, stable, monitor   Oneal GroutMAHIMA Escher Harr, MD  [facility] [phone] (Monday-Friday 8 am - 5 pm) [phone] (afterhours)

## 2015-02-04 ENCOUNTER — Other Ambulatory Visit: Payer: Self-pay | Admitting: Internal Medicine

## 2015-02-04 DIAGNOSIS — Z1231 Encounter for screening mammogram for malignant neoplasm of breast: Secondary | ICD-10-CM

## 2015-02-23 ENCOUNTER — Ambulatory Visit
Admission: RE | Admit: 2015-02-23 | Discharge: 2015-02-23 | Disposition: A | Discharge status: Home / Self Care | Payer: Medicare Other | Source: Ambulatory Visit | Attending: Internal Medicine | Admitting: Internal Medicine

## 2015-02-23 DIAGNOSIS — Z1231 Encounter for screening mammogram for malignant neoplasm of breast: Secondary | ICD-10-CM

## 2015-02-25 ENCOUNTER — Encounter: Payer: Self-pay | Admitting: Internal Medicine

## 2015-02-25 ENCOUNTER — Non-Acute Institutional Stay (SKILLED_NURSING_FACILITY): Payer: Medicare Other | Admitting: Internal Medicine

## 2015-02-25 DIAGNOSIS — M899 Disorder of bone, unspecified: Secondary | ICD-10-CM

## 2015-02-25 DIAGNOSIS — F330 Major depressive disorder, recurrent, mild: Secondary | ICD-10-CM | POA: Diagnosis not present

## 2015-02-25 DIAGNOSIS — E785 Hyperlipidemia, unspecified: Secondary | ICD-10-CM | POA: Diagnosis not present

## 2015-02-25 DIAGNOSIS — I639 Cerebral infarction, unspecified: Secondary | ICD-10-CM | POA: Diagnosis not present

## 2015-02-25 DIAGNOSIS — G250 Essential tremor: Secondary | ICD-10-CM | POA: Diagnosis not present

## 2015-02-25 DIAGNOSIS — N760 Acute vaginitis: Secondary | ICD-10-CM

## 2015-02-25 DIAGNOSIS — K219 Gastro-esophageal reflux disease without esophagitis: Secondary | ICD-10-CM

## 2015-02-25 DIAGNOSIS — M8580 Other specified disorders of bone density and structure, unspecified site: Secondary | ICD-10-CM

## 2015-02-25 DIAGNOSIS — K5901 Slow transit constipation: Secondary | ICD-10-CM

## 2015-02-25 DIAGNOSIS — E039 Hypothyroidism, unspecified: Secondary | ICD-10-CM | POA: Diagnosis not present

## 2015-02-25 DIAGNOSIS — G40909 Epilepsy, unspecified, not intractable, without status epilepticus: Secondary | ICD-10-CM

## 2015-02-25 NOTE — Progress Notes (Signed)
Patient ID: Carma Dwiggins, female   DOB: 08/02/1936, 79 y.o.   MRN: [id number]   [facility]  Chief Complaint  Patient presents with  . Annual Exam    annual exam    Allergies: lasix, latex   HPI 79 y/o female patient is seen today for annual exam. She denies any concerns this visit. She has pmh of CVA, constipation, seizures, HLD, GERD. No new concerns from staff. No falls reported. No new skin concern. Compliant with her medications. Never had colonoscopy in past. Has not had dexa scan before. uptodate on mammogram 02/23/15 normal.   Review of Systems   Constitutional: Negative for fever, chills, diaphoresis.   HENT: Negative for congestion, sore throat.    Eyes: Negative for blurred vision, double vision and discharge. Wears glasses   Respiratory: Negative for cough, shortness of breath and wheezing.    Cardiovascular: Negative for chest pain, palpitations, leg swelling.   Gastrointestinal: Negative for heartburn, nausea, vomiting, abdominal pain, melena or rectal bleed Genitourinary: Negative for dysuria   Musculoskeletal: Negative for back pain, falls. Uses a walker Skin: Negative for itching and rash.   Neurological: Negative for dizziness, headaches.   Psychiatric/Behavioral: Negative for depression   PAST MEDICAL HISTORY reviewed    Medication List       This list is accurate as of: 02/25/15  3:50 PM.  Always use your most recent med list.               atorvastatin 10 MG tablet  Commonly known as:  LIPITOR  Take 10 mg by mouth at bedtime.     CALTRATE 600+D 600-400 MG-UNIT per tablet  Generic drug:  Calcium Carbonate-Vitamin D  Take 1 tablet by mouth 2 (two) times daily. For osteoporosis     cholecalciferol 1000 UNITS tablet  Commonly known as:  VITAMIN D  Take 1,000 Units by mouth daily. Take 1 tablet by mouth daily for senile osteoporosis     clopidogrel 75 MG tablet  Commonly known as:  PLAVIX  Take 75 mg by mouth daily. Take 1 tablet by  mouth daily for CVA     docusate sodium 50 MG capsule  Commonly known as:  COLACE  Take 50 mg by mouth daily.     escitalopram 5 MG tablet  Commonly known as:  LEXAPRO  Take 5 mg by mouth daily. For Depression     fluconazole 200 MG tablet  Commonly known as:  DIFLUCAN  1 by mouth once weekly (Saturday) for vulvovaginitis     gabapentin 400 MG capsule  Commonly known as:  NEURONTIN  Take 400 mg by mouth. Take 1 capsule  =400 mg by mouth every night at bedtime for tremors     gabapentin 100 MG capsule  Commonly known as:  NEURONTIN  Take 100 mg by mouth every morning. For tremors     levothyroxine 25 MCG tablet  Commonly known as:  SYNTHROID, LEVOTHROID  Take 25 mcg by mouth daily before breakfast.     loratadine 10 MG tablet  Commonly known as:  CLARITIN  Take 10 mg by mouth daily as needed. For allergies     nitroGLYCERIN 0.4 MG SL tablet  Commonly known as:  NITROSTAT  Place 0.4 mg under the tongue every 5 (five) minutes as needed for chest pain (repeat for 3 doses prn).     nystatin-triamcinolone cream  Commonly known as:  MYCOLOG II  Apply 1 application topically 2 (two) times daily. Apply  to inner thighs and vulva     pantoprazole 20 MG tablet  Commonly known as:  PROTONIX  Take 20 mg by mouth daily.     PHENobarbital 64.8 MG tablet  Commonly known as:  LUMINAL  Take one tablet by mouth twice daily for seizures     PHENobarbital 32.4 MG tablet  Commonly known as:  LUMINAL  Take one tablet by mouth every night at bedtime     phenytoin 100 MG ER capsule  Commonly known as:  DILANTIN  Take 200 mg by mouth 2 (two) times daily. For seizures     Polyethyl Glycol-Propyl Glycol 0.4-0.3 % Soln  Apply to eye. Instill 1 drop each eye twice  Daily for dry eyes wait 3-5 minutes between 2 eye meds.     polyethylene glycol packet  Commonly known as:  MIRALAX / GLYCOLAX  Take 17 g by mouth daily. Mix 17 gm with 4-8 oz of liquid daily for constipation.      sennosides-docusate sodium 8.6-50 MG tablet  Commonly known as:  SENOKOT-S  Take 2 tablets by mouth daily.     vitamin B-12 500 MCG tablet  Commonly known as:  CYANOCOBALAMIN  Take 500 mcg by mouth daily.        Physical exam BP 113/65 mmHg  Pulse 78  Temp(Src) 97.7 F (36.5 C) (Oral)  Resp 18  Ht 5\' 3"  (1.6 m)  SpO2 97%  Wt Readings from Last 3 Encounters:  01/27/15 184 lb 6.4 oz (83.643 kg)  01/04/15 186 lb 3.2 oz (84.46 kg)  05/01/13 198 lb 6.4 oz (89.994 kg)    General- elderly female, obese, in no acute distress Head- atraumatic, normocephalic Eyes- PERRLA, EOMI, no pallor, no icterus, no discharge Neck- no lymphadenopathy, no thyromegaly, no JVD Mouth- normal mucus membrane, dentures present, normal oropharynx Cardiovascular- normal s1,s2, no murmurs, normal distal pulses Respiratory- bilateral clear to auscultation, no wheeze, no rhonchi, no crackles Abdomen- bowel sounds present, soft, non tender, no guarding/ rigidity Musculoskeletal- able to move all 4 extremities, no leg edema, left sided weakness, uses a walker Neurological- no focal deficit Psychiatry- alert and oriented to person, place and time, normal mood and affect  Labs- 07/27/14 t.chol 164, hdl 62, ldl 78, tg 119, wbc 4.5, hb 11.6, hct 35.3, mcv 91.5, plt 200, na 134, k 4.5, bun 12, cr 0.4 11/26/14 b12 254, wbc 4, hb 11.6, hct 34.3, plt 237, na 137, k 4.2, glu 105, bun 13, cr 0.52, lft wnl, tsh 5.587, phenytoin 19, phenobarbital 39.2 12/28/14 wbc 3.9, hb 11, hct 33, tsh 3.527 02/23/15 mammogram- normal  Assessment/plan  Annual exam Reviewed mammogram. Get FOBT X 3 for colon cancer screening. Get mobile dexa scan. uptodate with influenza and pneumococcal vaccine. Labs reviewed  Senile osteopenia Post menopausal. cxr 2011 shows DJD changes and scoliosis. Get dexa scan to assess further. Continue caltrate with vitamin d  Hyperlipidemia Reviewed lipid panel from 11/15. On lipitor 10 mg daily, continue  this and check lipid yearly  Essential tremors Stable, continue neurontin 100 mg am and 400 mg pm  Old cva Stable, continue plavix 75 mg daily and lipitor 10 mg daily  gerd Stable, continue protonix 20 mg daily  Vulvovaginitis Recurrent, on diflucan weekly for this, stable, monitor, has pending gyn follow up  Hypothyroidism Reviewed tsh from 4/16. Continue levothyroxine 25 mcg daily  Constipation Stable, continue miralax, senokot s and colace  Seizure disorder Remains seizure free. Continue phenobarbital and dilantin, monitor levels.   Depressive  disorder Stable, tolerating increased dose of lexapro 10 mg daily well for now. monitor   Oneal Grout, MD  [facility] [phone] (Monday-Friday 8 am - 5 pm) [phone] (afterhours)

## 2015-03-21 ENCOUNTER — Non-Acute Institutional Stay (SKILLED_NURSING_FACILITY): Payer: Medicare Other | Admitting: Internal Medicine

## 2015-03-21 ENCOUNTER — Encounter: Payer: Self-pay | Admitting: Internal Medicine

## 2015-03-21 DIAGNOSIS — B351 Tinea unguium: Secondary | ICD-10-CM

## 2015-03-21 DIAGNOSIS — F330 Major depressive disorder, recurrent, mild: Secondary | ICD-10-CM | POA: Diagnosis not present

## 2015-03-21 DIAGNOSIS — H04123 Dry eye syndrome of bilateral lacrimal glands: Secondary | ICD-10-CM | POA: Diagnosis not present

## 2015-03-21 DIAGNOSIS — I639 Cerebral infarction, unspecified: Secondary | ICD-10-CM | POA: Diagnosis not present

## 2015-03-21 NOTE — Progress Notes (Signed)
Patient ID: Lauren Burch, female   DOB: [birth year], 79 y.o.   MRN: [id number]     [facility]  Chief Complaint  Patient presents with  . Medical Management of Chronic Issues    Allergies: lasix, latex  HPI 79 y/o female patient is seen today for routine visit. She complaints of the appearance of her nails in her hands. denies any other concerns this visit. She has pmh of CVA, constipation, seizures, HLD, GERD. No new concerns from staff. No falls reported. No new skin concern. Compliant with her medications.   Review of Systems   Constitutional: Negative for fever, chills, diaphoresis.   HENT: Negative for congestion, sore throat.    Eyes: Negative for blurred vision, double vision and discharge. Wears glasses   Respiratory: Negative for cough, shortness of breath and wheezing.    Cardiovascular: Negative for chest pain, palpitations, leg swelling.   Gastrointestinal: Negative for heartburn, nausea, vomiting, abdominal pain, melena or rectal bleed Genitourinary: Negative for dysuria   Musculoskeletal: Negative for back pain, falls. Uses a wheelchair and walker Skin: Negative for itching and rash.   Neurological: Negative for dizziness, headaches.   Psychiatric/Behavioral: Negative for depression   PAST MEDICAL HISTORY reviewed    Medication List       This list is accurate as of: 03/21/15  3:04 PM.  Always use your most recent med list.               atorvastatin 10 MG tablet  Commonly known as:  LIPITOR  Take 10 mg by mouth at bedtime.     CALTRATE 600+D 600-400 MG-UNIT per tablet  Generic drug:  Calcium Carbonate-Vitamin D  Take 1 tablet by mouth 2 (two) times daily. For osteoporosis     cholecalciferol 1000 UNITS tablet  Commonly known as:  VITAMIN D  Take 1,000 Units by mouth daily. Take 1 tablet by mouth daily for senile osteoporosis     clopidogrel 75 MG tablet  Commonly known as:  PLAVIX  Take 75 mg by mouth daily. Take 1 tablet by mouth  daily for CVA     docusate sodium 50 MG capsule  Commonly known as:  COLACE  Take 50 mg by mouth daily.     escitalopram 5 MG tablet  Commonly known as:  LEXAPRO  Take 5 mg by mouth daily. For Depression     fluconazole 200 MG tablet  Commonly known as:  DIFLUCAN  1 by mouth once weekly (Saturday) for vulvovaginitis     gabapentin 400 MG capsule  Commonly known as:  NEURONTIN  Take 400 mg by mouth. Take 1 capsule  =400 mg by mouth every night at bedtime for tremors     gabapentin 100 MG capsule  Commonly known as:  NEURONTIN  Take 100 mg by mouth every morning. For tremors     levothyroxine 25 MCG tablet  Commonly known as:  SYNTHROID, LEVOTHROID  Take 25 mcg by mouth daily before breakfast.     loratadine 10 MG tablet  Commonly known as:  CLARITIN  Take 10 mg by mouth daily as needed. For allergies     nitroGLYCERIN 0.4 MG SL tablet  Commonly known as:  NITROSTAT  Place 0.4 mg under the tongue every 5 (five) minutes as needed for chest pain (repeat for 3 doses prn).     nystatin-triamcinolone cream  Commonly known as:  MYCOLOG II  Apply 1 application topically 2 (two) times daily. Apply to inner thighs and  vulva     pantoprazole 20 MG tablet  Commonly known as:  PROTONIX  Take 20 mg by mouth daily.     PHENobarbital 64.8 MG tablet  Commonly known as:  LUMINAL  Take one tablet by mouth twice daily for seizures     PHENobarbital 32.4 MG tablet  Commonly known as:  LUMINAL  Take one tablet by mouth every night at bedtime     phenytoin 100 MG ER capsule  Commonly known as:  DILANTIN  Take 200 mg by mouth 2 (two) times daily. For seizures     Polyethyl Glycol-Propyl Glycol 0.4-0.3 % Soln  Apply to eye. Instill 1 drop each eye twice  Daily for dry eyes wait 3-5 minutes between 2 eye meds.     polyethylene glycol packet  Commonly known as:  MIRALAX / GLYCOLAX  Take 17 g by mouth daily. Mix 17 gm with 4-8 oz of liquid daily for constipation.      sennosides-docusate sodium 8.6-50 MG tablet  Commonly known as:  SENOKOT-S  Take 2 tablets by mouth daily.     vitamin B-12 500 MCG tablet  Commonly known as:  CYANOCOBALAMIN  Take 500 mcg by mouth daily.        Physical exam BP 112/65 mmHg  Pulse 66  Temp(Src) 98 F (36.7 C)  Resp 19  Ht  (1.6 m)  Wt 185 lb (83.915 kg)  BMI 32.78 kg/m2  SpO2 95%  Wt Readings from Last 3 Encounters:  03/21/15 185 lb (83.915 kg)  01/27/15 184 lb 6.4 oz (83.643 kg)  01/04/15 186 lb 3.2 oz (84.46 kg)    General- elderly female, obese, in no acute distress Head- atraumatic, normocephalic Eyes- PERRLA, EOMI, no pallor, no icterus, no discharge Neck- no lymphadenopathy, no thyromegaly, no JVD Mouth- normal mucus membrane, dentures present, normal oropharynx Cardiovascular- normal s1,s2, no murmurs, normal distal pulses Respiratory- bilateral clear to auscultation, no wheeze, no rhonchi, no crackles Abdomen- bowel sounds present, soft, non tender, no guarding/ rigidity Musculoskeletal- able to move all 4 extremities, no leg edema, left sided weakness, uses a walker and wheelchair Neurological- no focal deficit Psychiatry- alert and oriented to person, place and time, normal mood and affect Nails- white superficial onychomycosis Skin- warm and dry  Labs- 07/27/14 t.chol 164, hdl 62, ldl 78, tg 119, wbc 4.5, hb 11.6, hct 35.3, mcv 91.5, plt 200, na 134, k 4.5, bun 12, cr 0.4 11/26/14 b12 254, wbc 4, hb 11.6, hct 34.3, plt 237, na 137, k 4.2, glu 105, bun 13, cr 0.52, lft wnl, tsh 5.587, phenytoin 19, phenobarbital 39.2 12/28/14 wbc 3.9, hb 11, hct 33, tsh 3.527 02/23/15 mammogram- normal  Assessment/plan  Superficial onychomycosis Clean nails with nail pain remover. No nail pain application for now. Start efinaconazole 10% topically to affected fingernails daily for 2 weeks and reassess.  CVA Stable, continue plavix 75 mg daily and lipitor 10 mg daily  Dry eyes Continue her systane  eye drops  Chronic depression Stable mood, continue lexapro 10 mg daily and monitor   Oneal Grout, MD  [facility] [phone] (Monday-Friday 8 am - 5 pm) [phone] (afterhours)

## 2015-03-21 NOTE — Progress Notes (Signed)
This encounter was created in error - please disregard.

## 2015-03-29 LAB — LIPID PANEL
Cholesterol: 126 mg/dL (ref 0–200)
HDL: 61 mg/dL (ref 35–70)
TRIGLYCERIDES: 84 mg/dL (ref 40–160)

## 2015-05-06 ENCOUNTER — Non-Acute Institutional Stay (SKILLED_NURSING_FACILITY): Payer: Medicare Other | Admitting: Internal Medicine

## 2015-05-06 DIAGNOSIS — E039 Hypothyroidism, unspecified: Secondary | ICD-10-CM | POA: Diagnosis not present

## 2015-05-06 DIAGNOSIS — I6990 Unspecified sequelae of unspecified cerebrovascular disease: Secondary | ICD-10-CM | POA: Diagnosis not present

## 2015-05-06 DIAGNOSIS — G629 Polyneuropathy, unspecified: Secondary | ICD-10-CM

## 2015-05-06 DIAGNOSIS — K219 Gastro-esophageal reflux disease without esophagitis: Secondary | ICD-10-CM

## 2015-05-12 LAB — CBC AND DIFFERENTIAL
HCT: 40 % (ref 36–46)
HEMOGLOBIN: 12.7 g/dL (ref 12.0–16.0)
PLATELETS: 185 10*3/uL (ref 150–399)
WBC: 3.7 10^3/mL

## 2015-05-12 LAB — HEPATIC FUNCTION PANEL
ALT: 28 U/L (ref 7–35)
AST: 25 U/L (ref 13–35)
Alkaline Phosphatase: 102 U/L (ref 25–125)
BILIRUBIN, TOTAL: 0.4 mg/dL

## 2015-05-12 LAB — BASIC METABOLIC PANEL
BUN: 13 mg/dL (ref 4–21)
Creatinine: 0.7 mg/dL (ref 0.5–1.1)
GLUCOSE: 103 mg/dL
POTASSIUM: 4.3 mmol/L (ref 3.4–5.3)
Sodium: 139 mmol/L (ref 137–147)

## 2015-05-17 ENCOUNTER — Other Ambulatory Visit: Payer: Self-pay | Admitting: Internal Medicine

## 2015-05-17 DIAGNOSIS — R269 Unspecified abnormalities of gait and mobility: Secondary | ICD-10-CM

## 2015-05-17 DIAGNOSIS — R296 Repeated falls: Secondary | ICD-10-CM

## 2015-05-22 NOTE — Progress Notes (Signed)
Patient ID: Lauren Burch, female   DOB: 1936-09-02, 79 y.o.   MRN: [id number]     [facility]  Chief Complaint  Patient presents with  . Medical Management of Chronic Issues    Allergies: lasix, latex  HPI 79 y/o female patient is seen today for routine visit. Her thyroud function has been stable with levothyroxine. reflux symptoms have been stable. She had a fall a week abck. No apparent injury reported. Remains seizure free. She has pmh of CVA, constipation, seizures, HLD, GERD. No new concerns from staff. No new skin concern. Compliant with her medications.   Review of Systems   Constitutional: Negative for fever, chills, diaphoresis.   HENT: Negative for congestion, sore throat.    Eyes: Negative for blurred vision, double vision and discharge. Wears glasses   Respiratory: Negative for cough, shortness of breath and wheezing.    Cardiovascular: Negative for chest pain, palpitations, leg swelling.   Gastrointestinal: Negative for heartburn, nausea, vomiting, abdominal pain, melena or rectal bleed Genitourinary: Negative for dysuria   Musculoskeletal: Negative for back pain. Uses a wheelchair and walker Skin: Negative for itching and rash.   Neurological: Negative for dizziness, headaches.   Psychiatric/Behavioral: Negative for depression   PAST MEDICAL HISTORY reviewed    Medication List       This list is accurate as of: 05/06/15 11:59 PM.  Always use your most recent med list.               atorvastatin 10 MG tablet  Commonly known as:  LIPITOR  Take 10 mg by mouth at bedtime.     CALTRATE 600+D 600-400 MG-UNIT per tablet  Generic drug:  Calcium Carbonate-Vitamin D  Take 1 tablet by mouth 2 (two) times daily. For osteoporosis     cholecalciferol 1000 UNITS tablet  Commonly known as:  VITAMIN D  Take 1,000 Units by mouth daily. Take 1 tablet by mouth daily for senile osteoporosis     clopidogrel 75 MG tablet  Commonly known as:  PLAVIX  Take 75  mg by mouth daily. Take 1 tablet by mouth daily for CVA     docusate sodium 50 MG capsule  Commonly known as:  COLACE  Take 50 mg by mouth daily.     escitalopram 5 MG tablet  Commonly known as:  LEXAPRO  Take 5 mg by mouth daily. For Depression     fluconazole 200 MG tablet  Commonly known as:  DIFLUCAN  1 by mouth once weekly (Saturday) for vulvovaginitis     gabapentin 400 MG capsule  Commonly known as:  NEURONTIN  Take 400 mg by mouth. Take 1 capsule  =400 mg by mouth every night at bedtime for tremors     gabapentin 100 MG capsule  Commonly known as:  NEURONTIN  Take 100 mg by mouth every morning. For tremors     levothyroxine 25 MCG tablet  Commonly known as:  SYNTHROID, LEVOTHROID  Take 25 mcg by mouth daily before breakfast.     loratadine 10 MG tablet  Commonly known as:  CLARITIN  Take 10 mg by mouth daily as needed. For allergies     nitroGLYCERIN 0.4 MG SL tablet  Commonly known as:  NITROSTAT  Place 0.4 mg under the tongue every 5 (five) minutes as needed for chest pain (repeat for 3 doses prn).     nystatin-triamcinolone cream  Commonly known as:  MYCOLOG II  Apply 1 application topically 2 (two) times daily.  Apply to inner thighs and vulva     pantoprazole 20 MG tablet  Commonly known as:  PROTONIX  Take 20 mg by mouth daily.     PHENobarbital 64.8 MG tablet  Commonly known as:  LUMINAL  Take one tablet by mouth twice daily for seizures     PHENobarbital 32.4 MG tablet  Commonly known as:  LUMINAL  Take one tablet by mouth every night at bedtime     phenytoin 100 MG ER capsule  Commonly known as:  DILANTIN  Take 200 mg by mouth 2 (two) times daily. For seizures     Polyethyl Glycol-Propyl Glycol 0.4-0.3 % Soln  Apply to eye. Instill 1 drop each eye twice  Daily for dry eyes wait 3-5 minutes between 2 eye meds.     polyethylene glycol packet  Commonly known as:  MIRALAX / GLYCOLAX  Take 17 g by mouth daily. Mix 17 gm with 4-8 oz of liquid daily  for constipation.     sennosides-docusate sodium 8.6-50 MG tablet  Commonly known as:  SENOKOT-S  Take 2 tablets by mouth daily.     vitamin B-12 500 MCG tablet  Commonly known as:  CYANOCOBALAMIN  Take 500 mcg by mouth daily.        Physical exam BP 115/60 mmHg  Pulse 65  Temp(Src) 98 F (36.7 C)  Resp 18  SpO2 95%  Wt Readings from Last 3 Encounters:  03/21/15 185 lb (83.915 kg)  01/27/15 184 lb 6.4 oz (83.643 kg)  01/04/15 186 lb 3.2 oz (84.46 kg)    General- elderly female, obese, in no acute distress Head- atraumatic, normocephalic Eyes- PERRLA, EOMI, no pallor, no icterus, no discharge Neck- no lymphadenopathy, no thyromegaly, no JVD Mouth- normal mucus membrane, dentures present, normal oropharynx Cardiovascular- normal s1,s2, no murmurs, normal distal pulses Respiratory- bilateral clear to auscultation, no wheeze, no rhonchi, no crackles Abdomen- bowel sounds present, soft, non tender, no guarding/ rigidity Musculoskeletal- able to move all 4 extremities, no leg edema, chronic left sided weakness, uses a walker and wheelchair Neurological- no focal deficit Psychiatry- alert and oriented to person, place and time, normal mood and affect Nails- white superficial onychomycosis Skin- warm and dry, leg with wraps  Labs- 07/27/14 t.chol 164, hdl 62, ldl 78, tg 119, wbc 4.5, hb 11.6, hct 35.3, mcv 91.5, plt 200, na 134, k 4.5, bun 12, cr 0.4 11/26/14 b12 254, wbc 4, hb 11.6, hct 34.3, plt 237, na 137, k 4.2, glu 105, bun 13, cr 0.52, lft wnl, tsh 5.587, phenytoin 19, phenobarbital 39.2 12/28/14 wbc 3.9, hb 11, hct 33, tsh 3.527 02/23/15 mammogram- normal 03/29/15 t.chol 126, tg 84, ldl 48, hdl 61  Assessment/plan  Hypothyroidism Stable, continue levothyroxine 25 mcg daily  gerd Stable with protonix 20 mg daily, chronic, consider decreasing dose next visit  Peripheral neuropathy Recent fall, fall precautions, to work with therapy team for gait training. Continue  neurontin  Late effect of cva Left sided weakness and had recent fall. Will need to work with therapy team for gait training and strengthening exercise.    Oneal Grout, MD  [facility] [phone] (Monday-Friday 8 am - 5 pm) [phone] (afterhours)

## 2015-05-24 LAB — TSH: TSH: 4.39 u[IU]/mL (ref <=5.90)

## 2015-05-25 ENCOUNTER — Ambulatory Visit (HOSPITAL_COMMUNITY)
Admission: RE | Admit: 2015-05-25 | Discharge: 2015-05-25 | Disposition: A | Discharge status: Home / Self Care | Payer: Medicare Other | Source: Ambulatory Visit | Attending: Internal Medicine | Admitting: Internal Medicine

## 2015-05-25 ENCOUNTER — Encounter (HOSPITAL_COMMUNITY): Payer: Self-pay

## 2015-05-25 DIAGNOSIS — R269 Unspecified abnormalities of gait and mobility: Secondary | ICD-10-CM

## 2015-05-25 DIAGNOSIS — R296 Repeated falls: Secondary | ICD-10-CM | POA: Insufficient documentation

## 2015-05-25 LAB — HM DIABETES EYE EXAM

## 2015-06-03 ENCOUNTER — Non-Acute Institutional Stay (SKILLED_NURSING_FACILITY): Payer: Medicare Other | Admitting: Internal Medicine

## 2015-06-03 DIAGNOSIS — E039 Hypothyroidism, unspecified: Secondary | ICD-10-CM

## 2015-06-03 DIAGNOSIS — R2681 Unsteadiness on feet: Secondary | ICD-10-CM

## 2015-06-03 DIAGNOSIS — G40909 Epilepsy, unspecified, not intractable, without status epilepticus: Secondary | ICD-10-CM | POA: Diagnosis not present

## 2015-06-03 DIAGNOSIS — E785 Hyperlipidemia, unspecified: Secondary | ICD-10-CM | POA: Diagnosis not present

## 2015-06-03 DIAGNOSIS — G629 Polyneuropathy, unspecified: Secondary | ICD-10-CM

## 2015-06-03 DIAGNOSIS — R296 Repeated falls: Secondary | ICD-10-CM | POA: Diagnosis not present

## 2015-06-03 NOTE — Progress Notes (Signed)
Patient ID: Lauren Burch, female   DOB: August 03, 1936, 79 y.o.   MRN: [id number]     [name]  Chief Complaint  Patient presents with  . Medical Management of Chronic Issues    Allergies: lasix, latex  HPI 79 y/o female patient is seen today for routine visit. She has been having frequent falls with last one being around a week back when she was trying to get out of the bathroom where she felt lightheaded and her legs giving away. she had a ct head ruling out acute abnormalities. Her lab work showed elevated dilantin level, dilantin dosing has been decreased. Remains seizure free. Her thyroid function has been stable with levothyroxine. Denies leg pain or cramps. Denies numbness or tingling at present.  Review of Systems   Constitutional: Negative for fever, chills, diaphoresis.   HENT: Negative for congestion, sore throat.    Eyes: Negative for blurred vision, double vision and discharge. Wears glasses   Respiratory: Negative for cough, shortness of breath and wheezing.    Cardiovascular: Negative for chest pain, palpitations, leg swelling.   Gastrointestinal: Negative for heartburn, nausea, vomiting, abdominal pain, melena or rectal bleed Genitourinary: Negative for dysuria   Musculoskeletal: Negative for back pain. Uses a wheelchair for now Skin: Negative for itching and rash.   Neurological: Negative for dizziness, headaches.   Psychiatric/Behavioral: Negative for depression   PAST MEDICAL HISTORY reviewed    Medication List       This list is accurate as of: 06/03/15  4:08 PM.  Always use your most recent med list.               atorvastatin 10 MG tablet  Commonly known as:  LIPITOR  Take 10 mg by mouth at bedtime.     CALTRATE 600+D 600-400 MG-UNIT per tablet  Generic drug:  Calcium Carbonate-Vitamin D  Take 1 tablet by mouth 2 (two) times daily. For osteoporosis     cholecalciferol 1000 UNITS tablet  Commonly known as:  VITAMIN D  Take 1,000  Units by mouth daily. Take 1 tablet by mouth daily for senile osteoporosis     clopidogrel 75 MG tablet  Commonly known as:  PLAVIX  Take 75 mg by mouth daily. Take 1 tablet by mouth daily for CVA     docusate sodium 50 MG capsule  Commonly known as:  COLACE  Take 50 mg by mouth daily.     escitalopram 5 MG tablet  Commonly known as:  LEXAPRO  Take 10 mg by mouth daily. For Depression     fluconazole 200 MG tablet  Commonly known as:  DIFLUCAN  1 by mouth once weekly (Saturday) for vulvovaginitis     gabapentin 100 MG capsule  Commonly known as:  NEURONTIN  Take 100 mg by mouth every morning. 100 mg am and 400 mg pm For tremors     levothyroxine 25 MCG tablet  Commonly known as:  SYNTHROID, LEVOTHROID  Take 50 mcg by mouth daily before breakfast.     loratadine 10 MG tablet  Commonly known as:  CLARITIN  Take 10 mg by mouth daily as needed. For allergies     MYRBETRIQ 25 MG Tb24 tablet  Generic drug:  mirabegron ER  Take 25 mg by mouth daily.     nitroGLYCERIN 0.4 MG SL tablet  Commonly known as:  NITROSTAT  Place 0.4 mg under the tongue every 5 (five) minutes as needed for chest pain (repeat for 3 doses prn).  nystatin-triamcinolone cream  Commonly known as:  MYCOLOG II  Apply 1 application topically 2 (two) times daily. Apply to inner thighs and vulva     pantoprazole 20 MG tablet  Commonly known as:  PROTONIX  Take 20 mg by mouth daily.     PHENobarbital 64.8 MG tablet  Commonly known as:  LUMINAL  Take one tablet by mouth twice daily for seizures     PHENobarbital 32.4 MG tablet  Commonly known as:  LUMINAL  Take one tablet by mouth every night at bedtime     phenytoin 100 MG ER capsule  Commonly known as:  DILANTIN  Take 100 mg by mouth 2 (two) times daily. For seizures     Polyethyl Glycol-Propyl Glycol 0.4-0.3 % Soln  Apply to eye. Instill 1 drop each eye twice  Daily for dry eyes wait 3-5 minutes between 2 eye meds.     polyethylene glycol  packet  Commonly known as:  MIRALAX / GLYCOLAX  Take 17 g by mouth daily. Mix 17 gm with 4-8 oz of liquid daily for constipation.     sennosides-docusate sodium 8.6-50 MG tablet  Commonly known as:  SENOKOT-S  Take 2 tablets by mouth daily.     vitamin B-12 500 MCG tablet  Commonly known as:  CYANOCOBALAMIN  Take 500 mcg by mouth daily.        Physical exam BP 121/60 mmHg  Pulse 63  Temp(Src) 98.3 F (36.8 C)  Resp 18  SpO2 92%  Wt Readings from Last 3 Encounters:  03/21/15 185 lb (83.915 kg)  01/27/15 184 lb 6.4 oz (83.643 kg)  01/04/15 186 lb 3.2 oz (84.46 kg)    General- elderly female, obese, in no acute distress Head- atraumatic, normocephalic Eyes- PERRLA, EOMI, no pallor, no icterus, no discharge Neck- no lymphadenopathy, no thyromegaly, no JVD Mouth- normal mucus membrane, dentures present, normal oropharynx Cardiovascular- normal s1,s2, no murmurs Respiratory- bilateral clear to auscultation, no wheeze, no rhonchi, no crackles Abdomen- bowel sounds present, soft, non tender, no guarding/ rigidity Musculoskeletal- able to move all 4 extremities, no leg edema, chronic left sided weakness, uses a wheelchair Neurological- left sided weakness from old cva Psychiatry- alert and oriented to person, place and time, normal mood and affect Skin- warm and dry, leg with wraps  Labs- 07/27/14 t.chol 164, hdl 62, ldl 78, tg 119, wbc 4.5, hb 11.6, hct 35.3, mcv 91.5, plt 200, na 134, k 4.5, bun 12, cr 0.4 11/26/14 b12 254, wbc 4, hb 11.6, hct 34.3, plt 237, na 137, k 4.2, glu 105, bun 13, cr 0.52, lft wnl, tsh 5.587, phenytoin 19, phenobarbital 39.2 12/28/14 wbc 3.9, hb 11, hct 33, tsh 3.527 02/23/15 mammogram- normal 03/29/15 t.chol 126, tg 84, ldl 48, hdl 61, tsh 3.376 09/15/08 na 139, k 4.3, bun 13, cr 0.72, lft wnl 05/26/15 dilantin level 29 05/30/15 phenobarbital 47.4 06/02/15 dilantin level 14.1  Assessment/plan  Unsteady gait With her frequent falls will have therapy  team evaluate patient for gait training and strengthening exercise. Ct head negative for NPH finding  Seizure disorder supratherapueitc level of dilantin and dilantin dose was decreased. Level now therapeutic, continue dilantin 100 mg bid. With phenobarbital level being high, change current regimen of 64.8 mg bid and 32.4 mg at bedtime to just 64.8 mg bid and monitor phenobarbital level in 2 weeks  Hypothyroidism Stable, unclear why the dosing was increased to 50 mcg daily. Change this back to levothyroxine 25 mcg daily for now  HLD With  LDL at goal, concern of possible myalgia/ myositis causing leg weakness and increasing her fall. D/c lipitor for now and check lipid panel in 3 months  Frequent falls Her unsteady gait from neuropathy, her hx of cva and left sided weakness could all be contributing to her fall. No acute neurofocal deficit on exam. To work with therapy team as above  Peripheral neuropathy Recent fall, fall precautions, to work with therapy team for gait training. Continue neurontin 100 mg am and 400 mg bedtime for now   Oneal Grout, MD  [facility] [phone] (Monday-Friday 8 am - 5 pm) [phone] (afterhours)

## 2015-06-16 LAB — HM MAMMOGRAPHY

## 2015-06-16 LAB — HM COLONOSCOPY

## 2015-06-16 LAB — HM PAP SMEAR

## 2015-07-04 ENCOUNTER — Encounter: Payer: Self-pay | Admitting: Internal Medicine

## 2015-07-04 NOTE — Progress Notes (Deleted)
Patient ID: Lauren Burch, female   DOB: June 29, 1936, 79 y.o.   MRN: [id number]     [facility]  Chief Complaint  Patient presents with  . Medical Management of Chronic Issues    Routine Visit     Allergies: lasix, latex  HPI 79 y/o female patient is seen today for routine visit. She has been having frequent falls with last one being around a week back when she was trying to get out of the bathroom where she felt lightheaded and her legs giving away. she had a ct head ruling out acute abnormalities. Her lab work showed elevated dilantin level, dilantin dosing has been decreased. Remains seizure free. Her thyroid function has been stable with levothyroxine. Denies leg pain or cramps. Denies numbness or tingling at present.  Review of Systems   Constitutional: Negative for fever, chills, diaphoresis.   HENT: Negative for congestion, sore throat.    Eyes: Negative for blurred vision, double vision and discharge. Wears glasses   Respiratory: Negative for cough, shortness of breath and wheezing.    Cardiovascular: Negative for chest pain, palpitations, leg swelling.   Gastrointestinal: Negative for heartburn, nausea, vomiting, abdominal pain, melena or rectal bleed Genitourinary: Negative for dysuria   Musculoskeletal: Negative for back pain. Uses a wheelchair for now Skin: Negative for itching and rash.   Neurological: Negative for dizziness, headaches.   Psychiatric/Behavioral: Negative for depression   PAST MEDICAL HISTORY reviewed    Medication List       This list is accurate as of: 07/04/15  2:54 PM.  Always use your most recent med list.               CALTRATE 600+D 600-400 MG-UNIT tablet  Generic drug:  Calcium Carbonate-Vitamin D  Take 1 tablet by mouth 2 (two) times daily. For osteoporosis     cholecalciferol 1000 UNITS tablet  Commonly known as:  VITAMIN D  Take 1,000 Units by mouth daily. Take 1 tablet by mouth daily for senile osteoporosis     clopidogrel 75 MG tablet  Commonly known as:  PLAVIX  Take 75 mg by mouth daily. Take 1 tablet by mouth daily for CVA     docusate sodium 50 MG capsule  Commonly known as:  COLACE  Take 50 mg by mouth daily.     escitalopram 5 MG tablet  Commonly known as:  LEXAPRO  Take 10 mg by mouth daily. For Depression     fluconazole 200 MG tablet  Commonly known as:  DIFLUCAN  1 by mouth once weekly (Saturday) for vulvovaginitis     gabapentin 100 MG capsule  Commonly known as:  NEURONTIN  Take 100 mg by mouth every morning. 100 mg am and 400 mg pm For tremors     levothyroxine 25 MCG tablet  Commonly known as:  SYNTHROID, LEVOTHROID  Take 50 mcg by mouth daily before breakfast.     loratadine 10 MG tablet  Commonly known as:  CLARITIN  Take 10 mg by mouth daily as needed. For allergies     MYRBETRIQ 25 MG Tb24 tablet  Generic drug:  mirabegron ER  Take 25 mg by mouth daily.     NAMENDA XR 21 MG Cp24  Generic drug:  Memantine HCl ER  Take by mouth daily. X 7 days starting 06/27/15 then increase to 28 mg     NAMENDA XR 28 MG Cp24 24 hr capsule  Generic drug:  memantine  Take 28 mg by mouth  daily.     nitroGLYCERIN 0.4 MG SL tablet  Commonly known as:  NITROSTAT  Place 0.4 mg under the tongue every 5 (five) minutes as needed for chest pain (repeat for 3 doses prn).     nystatin-triamcinolone cream  Commonly known as:  MYCOLOG II  Apply 1 application topically 2 (two) times daily. Apply to inner thighs and vulva     pantoprazole 20 MG tablet  Commonly known as:  PROTONIX  Take 20 mg by mouth daily.     PHENobarbital 64.8 MG tablet  Commonly known as:  LUMINAL  Take one tablet by mouth twice daily for seizures     phenytoin 100 MG ER capsule  Commonly known as:  DILANTIN  Take 100 mg by mouth 3 (three) times daily. For seizures     polyethylene glycol packet  Commonly known as:  MIRALAX / GLYCOLAX  Take 17 g by mouth 2 (two) times daily. Mix 17 gm with 4-8 oz of liquid  daily for constipation.     sennosides-docusate sodium 8.6-50 MG tablet  Commonly known as:  SENOKOT-S  Take 2 tablets by mouth daily.     vitamin B-12 500 MCG tablet  Commonly known as:  CYANOCOBALAMIN  Take 500 mcg by mouth daily.        Physical exam BP 110/61 mmHg  Pulse 71  Temp(Src) 97.7 F (36.5 C) (Oral)  Resp 19  Wt Readings from Last 3 Encounters:  03/21/15 185 lb (83.915 kg)  01/27/15 184 lb 6.4 oz (83.643 kg)  01/04/15 186 lb 3.2 oz (84.46 kg)    General- elderly female, obese, in no acute distress Head- atraumatic, normocephalic Eyes- PERRLA, EOMI, no pallor, no icterus, no discharge Neck- no lymphadenopathy, no thyromegaly, no JVD Mouth- normal mucus membrane, dentures present, normal oropharynx Cardiovascular- normal s1,s2, no murmurs Respiratory- bilateral clear to auscultation, no wheeze, no rhonchi, no crackles Abdomen- bowel sounds present, soft, non tender, no guarding/ rigidity Musculoskeletal- able to move all 4 extremities, no leg edema, chronic left sided weakness, uses a wheelchair Neurological- left sided weakness from old cva Psychiatry- alert and oriented to person, place and time, normal mood and affect Skin- warm and dry, leg with wraps  Labs- 07/27/14 t.chol 164, hdl 62, ldl 78, tg 119, wbc 4.5, hb 11.6, hct 35.3, mcv 91.5, plt 200, na 134, k 4.5, bun 12, cr 0.4 11/26/14 b12 254, wbc 4, hb 11.6, hct 34.3, plt 237, na 137, k 4.2, glu 105, bun 13, cr 0.52, lft wnl, tsh 5.587, phenytoin 19, phenobarbital 39.2 12/28/14 wbc 3.9, hb 11, hct 33, tsh 3.527 02/23/15 mammogram- normal 03/29/15 t.chol 126, tg 84, ldl 48, hdl 61, tsh 3.376 03/17/45 na 139, k 4.3, bun 13, cr 0.72, lft wnl 05/26/15 dilantin level 29 05/30/15 phenobarbital 47.4 06/02/15 dilantin level 14.1  Assessment/plan  Unsteady gait With her frequent falls will have therapy team evaluate patient for gait training and strengthening exercise. Ct head negative for NPH finding  Seizure  disorder supratherapueitc level of dilantin and dilantin dose was decreased. Level now therapeutic, continue dilantin 100 mg bid. With phenobarbital level being high, change current regimen of 64.8 mg bid and 32.4 mg at bedtime to just 64.8 mg bid and monitor phenobarbital level in 2 weeks  Hypothyroidism Stable, unclear why the dosing was increased to 50 mcg daily. Change this back to levothyroxine 25 mcg daily for now  HLD With LDL at goal, concern of possible myalgia/ myositis causing leg weakness and increasing her  fall. D/c lipitor for now and check lipid panel in 3 months  Frequent falls Her unsteady gait from neuropathy, her hx of cva and left sided weakness could all be contributing to her fall. No acute neurofocal deficit on exam. To work with therapy team as above  Peripheral neuropathy Recent fall, fall precautions, to work with therapy team for gait training. Continue neurontin 100 mg am and 400 mg bedtime for now   Oneal GroutMAHIMA PANDEY, MD  [facility] [phone] (Monday-Friday 8 am - 5 pm) [phone] (afterhours)

## 2015-07-04 NOTE — Progress Notes (Signed)
This encounter was created in error - please disregard.

## 2015-07-08 LAB — TSH: TSH: 2.64 u[IU]/mL (ref 0.41–5.90)

## 2015-08-15 ENCOUNTER — Encounter: Payer: Self-pay | Admitting: Internal Medicine

## 2015-08-15 ENCOUNTER — Non-Acute Institutional Stay (SKILLED_NURSING_FACILITY): Payer: Medicare Other | Admitting: Internal Medicine

## 2015-08-15 DIAGNOSIS — E538 Deficiency of other specified B group vitamins: Secondary | ICD-10-CM | POA: Diagnosis not present

## 2015-08-15 DIAGNOSIS — E038 Other specified hypothyroidism: Secondary | ICD-10-CM

## 2015-08-15 DIAGNOSIS — G40909 Epilepsy, unspecified, not intractable, without status epilepticus: Secondary | ICD-10-CM

## 2015-08-15 NOTE — Progress Notes (Signed)
Patient ID: Lauren Burch, female   DOB: Oct 27, 1935, 79 y.o.   MRN: [id number]     [facility]  No chief complaint on file.   Allergies: lasix, latex  HPI 79 y/o female patient is seen today for routine visit. She has been working with therapy team and is back to participating in her group activities. Her b12 supplement has been provided. Remains seizure free.  Getting her thyroid supplement.   Review of Systems   Constitutional: Negative for fever.   HENT: Negative for congestion, sore throat.    Eyes: Negative for blurred vision, double vision and discharge. Wears glasses   Respiratory: Negative for cough, shortness of breath and wheezing.    Cardiovascular: Negative for chest pain, palpitations, leg swelling.   Gastrointestinal: Negative for heartburn, nausea, vomiting, abdominal pain, melena or rectal bleed Neurological: Negative for headaches.   Psychiatric/Behavioral: Negative for depression   PAST MEDICAL HISTORY reviewed    Medication List       This list is accurate as of: 08/15/15  4:17 PM.  Always use your most recent med list.               CALTRATE 600+D 600-400 MG-UNIT tablet  Generic drug:  Calcium Carbonate-Vitamin D  Take 1 tablet by mouth 2 (two) times daily. For osteoporosis     cholecalciferol 1000 UNITS tablet  Commonly known as:  VITAMIN D  Take 1,000 Units by mouth daily. Take 1 tablet by mouth daily for senile osteoporosis     clopidogrel 75 MG tablet  Commonly known as:  PLAVIX  Take 75 mg by mouth daily. Take 1 tablet by mouth daily for CVA     docusate sodium 50 MG capsule  Commonly known as:  COLACE  Take 50 mg by mouth daily.     escitalopram 10 MG tablet  Commonly known as:  LEXAPRO  Take 10 mg by mouth daily.     fluconazole 200 MG tablet  Commonly known as:  DIFLUCAN  1 by mouth once weekly (Saturday) for vulvovaginitis     gabapentin 400 MG capsule  Commonly known as:  NEURONTIN  Take 400 mg by mouth at  bedtime.     gabapentin 100 MG capsule  Commonly known as:  NEURONTIN  Take 100 mg by mouth daily. In the morning     levothyroxine 25 MCG tablet  Commonly known as:  SYNTHROID, LEVOTHROID  Take 50 mcg by mouth daily before breakfast.     loratadine 10 MG tablet  Commonly known as:  CLARITIN  Take 10 mg by mouth daily as needed. For allergies     MYRBETRIQ 25 MG Tb24 tablet  Generic drug:  mirabegron ER  Take 25 mg by mouth daily.     NAMENDA XR 28 MG Cp24 24 hr capsule  Generic drug:  memantine  Take 28 mg by mouth daily.     nitroGLYCERIN 0.4 MG SL tablet  Commonly known as:  NITROSTAT  Place 0.4 mg under the tongue every 5 (five) minutes as needed for chest pain (repeat for 3 doses prn).     nystatin-triamcinolone cream  Commonly known as:  MYCOLOG II  Apply 1 application topically 2 (two) times daily. Apply to inner thighs and vulva     pantoprazole 20 MG tablet  Commonly known as:  PROTONIX  Take 20 mg by mouth daily.     PHENobarbital 64.8 MG tablet  Commonly known as:  LUMINAL  Take one tablet  by mouth twice daily for seizures     phenytoin 200 MG ER capsule  Commonly known as:  DILANTIN  Take 200 mg by mouth 2 (two) times daily.     polyethylene glycol packet  Commonly known as:  MIRALAX / GLYCOLAX  Take 17 g by mouth 2 (two) times daily. Mix 17 gm with 4-8 oz of liquid daily for constipation.     sennosides-docusate sodium 8.6-50 MG tablet  Commonly known as:  SENOKOT-S  Take 2 tablets by mouth at bedtime.     vitamin B-12 500 MCG tablet  Commonly known as:  CYANOCOBALAMIN  Take 500 mcg by mouth daily.        Physical exam BP 102/56 mmHg  Pulse 76  Temp(Src) 98.3 F (36.8 C) (Oral)  Resp 16  SpO2 94%  Wt Readings from Last 3 Encounters:  03/21/15 185 lb (83.915 kg)  01/27/15 184 lb 6.4 oz (83.643 kg)  01/04/15 186 lb 3.2 oz (84.46 kg)    General- elderly female, obese, in no acute distress Head- atraumatic, normocephalic Eyes- no  pallor, no icterus, no discharge Neck- no lymphadenopathy Mouth- normal mucus membrane, dentures present, normal oropharynx Cardiovascular- normal s1,s2, no murmurs Respiratory- bilateral clear to auscultation, no wheeze, no rhonchi, no crackles Abdomen- bowel sounds present, soft, non tender, no guarding/ rigidity Musculoskeletal- able to move all 4 extremities, no leg edema, chronic left sided weakness, uses a wheelchair Neurological- left sided weakness from old cva Psychiatry- alert and oriented to person, place and time Skin- warm and dry  Labs- CBC Latest Ref Rng 05/12/2015 01/25/2015 12/28/2014  WBC - 3.7 4.4 3.9  Hemoglobin 12.0 - 16.0 g/dL 16.112.7 11.9(A) 11.0(A)  Hematocrit 36 - 46 % 40 35(A) 33(A)  Platelets 150 - 399 K/L 185 214 197   CMP Latest Ref Rng 05/12/2015 01/25/2015 11/26/2014  Glucose 70 - 99 mg/dL - - -  BUN 4 - 21 mg/dL 13 12 13   Creatinine 0.5 - 1.1 mg/dL 0.7 0.5 0.5  Sodium 096137 - 147 mmol/L 139 137 137  Potassium 3.4 - 5.3 mmol/L 4.3 4.3 4.2  Chloride 96 - 112 mEq/L - - -  CO2 19 - 32 mEq/L - - -  Calcium 8.4 - 10.5 mg/dL - - -  Total Protein 6.0 - 8.3 g/dL - - -  Total Bilirubin 0.3 - 1.2 mg/dL - - -  Alkaline Phos 25 - 125 U/L 102 82 97  AST 13 - 35 U/L 25 21 24   ALT 7 - 35 U/L 28 25 27    12/28/14 wbc 3.9, hb 11, hct 33, tsh 3.527 02/23/15 mammogram- normal 03/29/15 t.chol 126, tg 84, ldl 48, hdl 61, tsh 3.376 0/4/549/1/16 na 139, k 4.3, bun 13, cr 0.72, lft wnl 05/26/15 dilantin level 29 05/30/15 phenobarbital 47.4 06/02/15 dilantin level 14.1  Assessment/plan  b12 deficiency  Continue b12 500 mcg daily, monitor cbc and b12 level  Seizure disorder continue dilantin 100 mg bid and phenobarbitol 64.8 mg bid, remains seizure free  Hypothyroidism stable, levothyroxine 25 mcg daily for now    Oneal GroutMAHIMA Nyemah Watton, MD  [facility] [phone] (Monday-Friday 8 am - 5 pm) [phone] (afterhours)

## 2015-09-19 ENCOUNTER — Non-Acute Institutional Stay (SKILLED_NURSING_FACILITY): Payer: Medicare Other | Admitting: Internal Medicine

## 2015-09-19 DIAGNOSIS — J309 Allergic rhinitis, unspecified: Secondary | ICD-10-CM | POA: Diagnosis not present

## 2015-09-19 DIAGNOSIS — K219 Gastro-esophageal reflux disease without esophagitis: Secondary | ICD-10-CM | POA: Diagnosis not present

## 2015-09-19 DIAGNOSIS — G629 Polyneuropathy, unspecified: Secondary | ICD-10-CM | POA: Diagnosis not present

## 2015-09-19 DIAGNOSIS — N3281 Overactive bladder: Secondary | ICD-10-CM | POA: Diagnosis not present

## 2015-09-19 DIAGNOSIS — R2681 Unsteadiness on feet: Secondary | ICD-10-CM

## 2015-09-19 DIAGNOSIS — K5901 Slow transit constipation: Secondary | ICD-10-CM

## 2015-09-19 NOTE — Progress Notes (Signed)
Patient ID: Lauren Burch, female   DOB: 02-Apr-1936, 80 y.o.   MRN: [id number]      [facility]  Chief Complaint  Patient presents with  . Medical Management of Chronic Issues    Allergies: lasix, latex  HPI 80 y/o female patient is seen today for routine visit. No further falls reported. Remains seizure free. Denies any dizziness. Has been working with therapy team and started using walker with therapy from today. Gets around with wheelchair by self. Denies reflux. Allergy under control. Had loose stool today. Had bowel movement once a day.  Review of Systems   Constitutional: Negative for fever.   HENT: Negative for congestion, sore throat.    Eyes: Negative for blurred vision, double vision and discharge. Wears glasses   Respiratory: Negative for cough, shortness of breath and wheezing.    Cardiovascular: Negative for chest pain, palpitations. Has some leg swelling.   Gastrointestinal: Negative for heartburn, nausea, vomiting, abdominal pain, melena or rectal bleed Genitourinary: Negative for dysuria   Musculoskeletal: Negative for back pain. Uses a wheelchair for now Skin: Negative for itching and rash.   Neurological: Negative for dizziness, headaches.   Psychiatric/Behavioral: Negative for depression   PAST MEDICAL HISTORY reviewed    Medication List       This list is accurate as of: 09/19/15  4:22 PM.  Always use your most recent med list.               CALTRATE 600+D 600-400 MG-UNIT tablet  Generic drug:  Calcium Carbonate-Vitamin D  Take 1 tablet by mouth 2 (two) times daily. For osteoporosis     cholecalciferol 1000 units tablet  Commonly known as:  VITAMIN D  Take 1,000 Units by mouth daily. Take 1 tablet by mouth daily for senile osteoporosis     clopidogrel 75 MG tablet  Commonly known as:  PLAVIX  Take 75 mg by mouth daily. Take 1 tablet by mouth daily for CVA     docusate sodium 50 MG capsule  Commonly known as:  COLACE  Take 50 mg by  mouth daily.     escitalopram 10 MG tablet  Commonly known as:  LEXAPRO  Take 10 mg by mouth daily.     fluconazole 200 MG tablet  Commonly known as:  DIFLUCAN  1 by mouth once weekly (Saturday) for vulvovaginitis     gabapentin 400 MG capsule  Commonly known as:  NEURONTIN  Take 400 mg by mouth at bedtime.     gabapentin 100 MG capsule  Commonly known as:  NEURONTIN  Take 100 mg by mouth daily. In the morning     levothyroxine 25 MCG tablet  Commonly known as:  SYNTHROID, LEVOTHROID  Take 50 mcg by mouth daily before breakfast.     loratadine 10 MG tablet  Commonly known as:  CLARITIN  Take 10 mg by mouth daily as needed. For allergies     MYRBETRIQ 25 MG Tb24 tablet  Generic drug:  mirabegron ER  Take 25 mg by mouth daily.     NAMENDA XR 28 MG Cp24 24 hr capsule  Generic drug:  memantine  Take 28 mg by mouth daily.     nitroGLYCERIN 0.4 MG SL tablet  Commonly known as:  NITROSTAT  Place 0.4 mg under the tongue every 5 (five) minutes as needed for chest pain (repeat for 3 doses prn).     nystatin-triamcinolone cream  Commonly known as:  MYCOLOG II  Apply 1  application topically 2 (two) times daily. Apply to inner thighs and vulva     pantoprazole 20 MG tablet  Commonly known as:  PROTONIX  Take 20 mg by mouth daily.     PHENobarbital 64.8 MG tablet  Commonly known as:  LUMINAL  Take one tablet by mouth twice daily for seizures     phenytoin 200 MG ER capsule  Commonly known as:  DILANTIN  Take 200 mg by mouth 2 (two) times daily.     polyethylene glycol packet  Commonly known as:  MIRALAX / GLYCOLAX  Take 17 g by mouth 2 (two) times daily. Mix 17 gm with 4-8 oz of liquid daily for constipation.     sennosides-docusate sodium 8.6-50 MG tablet  Commonly known as:  SENOKOT-S  Take 2 tablets by mouth at bedtime.     vitamin B-12 500 MCG tablet  Commonly known as:  CYANOCOBALAMIN  Take 500 mcg by mouth daily.        Physical exam BP 105/64 mmHg   Pulse 74  Temp(Src) 98.1 F (36.7 C)  Resp 16  SpO2 92%  Wt Readings from Last 3 Encounters:  03/21/15 185 lb (83.915 kg)  01/27/15 184 lb 6.4 oz (83.643 kg)  01/04/15 186 lb 3.2 oz (84.46 kg)    General- elderly female, obese, in no acute distress Head- atraumatic, normocephalic Eyes- PERRLA, EOMI, no pallor, no icterus, no discharge Neck- no lymphadenopathy, no thyromegaly, no JVD Mouth- normal mucus membrane, dentures present Cardiovascular- normal s1,s2, no murmurs Respiratory- bilateral clear to auscultation, no wheeze, no rhonchi, no crackles Abdomen- bowel sounds present, soft, non tender, no guarding/ rigidity Musculoskeletal- able to move all 4 extremities, trace leg edema, chronic left sided weakness, uses a wheelchair Neurological- left sided weakness from old cva Psychiatry- alert and oriented to person, place and time, normal mood and affect Skin- warm and dry  Labs- 12/28/14 wbc 3.9, hb 11, hct 33, tsh 3.527 02/23/15 mammogram- normal 03/29/15 t.chol 126, tg 84, ldl 48, hdl 61, tsh 3.376 1/6/109/1/16 na 139, k 4.3, bun 13, cr 0.72, lft wnl 05/26/15 dilantin level 29 05/30/15 phenobarbital 47.4 06/02/15 dilantin level 14.1  CBC Latest Ref Rng 05/12/2015 01/25/2015 12/28/2014  WBC - 3.7 4.4 3.9  Hemoglobin 12.0 - 16.0 g/dL 96.012.7 11.9(A) 11.0(A)  Hematocrit 36 - 46 % 40 35(A) 33(A)  Platelets 150 - 399 K/L 185 214 197     Assessment/plan  Allergic rhinitis Stable, continue claritin 10 mg daily prn  gerd Stable, denies symptom, on protonix 20 mg daily at present. Discontinue protonix and monitor clinically. Start ranitidine 75 mg daily prn only and monitor  Constipation On colace 50 mg daily, senokot s 2 tab qhs and miralax daily. Change senna s to 1 tab qhs and miralax to daily as needed only and monitor  OAB Continue mrbetriq 25 mg daily and monitor  Unsteady gait Improved, working with therapy team, no further falls. Using walker with therapy and wheelchair by  herself.   Peripheral neuropathy Continue neurontin 100 mg am and 400 mg bedtime for now   Oneal GroutMAHIMA Ameyah Bangura, MD  [facility] [phone] (Monday-Friday 8 am - 5 pm) [phone] (afterhours)

## 2015-10-19 LAB — BASIC METABOLIC PANEL
BUN: 14 mg/dL (ref 4–21)
CREATININE: 0.6 mg/dL (ref 0.5–1.1)
GLUCOSE: 105 mg/dL
Potassium: 4.7 mmol/L (ref 3.4–5.3)
Sodium: 138 mmol/L (ref 137–147)

## 2015-10-19 LAB — TSH: TSH: 4.92 u[IU]/mL (ref 0.41–5.90)

## 2015-10-19 LAB — HEPATIC FUNCTION PANEL
ALT: 20 U/L (ref 7–35)
AST: 19 U/L (ref 13–35)
Alkaline Phosphatase: 91 U/L (ref 25–125)
Bilirubin, Total: 0.3 mg/dL

## 2015-10-19 LAB — LIPID PANEL
Cholesterol: 174 mg/dL (ref 0–200)
HDL: 64 mg/dL (ref 35–70)
LDL Cholesterol: 92 mg/dL
Triglycerides: 87 mg/dL (ref 40–160)

## 2015-10-19 LAB — HEMOGLOBIN A1C: HEMOGLOBIN A1C: 5.4

## 2015-10-21 ENCOUNTER — Non-Acute Institutional Stay (SKILLED_NURSING_FACILITY): Payer: Medicare Other | Admitting: Internal Medicine

## 2015-10-21 ENCOUNTER — Encounter: Payer: Self-pay | Admitting: Internal Medicine

## 2015-10-21 DIAGNOSIS — K5900 Constipation, unspecified: Secondary | ICD-10-CM | POA: Diagnosis not present

## 2015-10-21 DIAGNOSIS — N760 Acute vaginitis: Secondary | ICD-10-CM

## 2015-10-21 DIAGNOSIS — F0150 Vascular dementia without behavioral disturbance: Secondary | ICD-10-CM

## 2015-10-21 DIAGNOSIS — K5909 Other constipation: Secondary | ICD-10-CM

## 2015-10-21 DIAGNOSIS — D519 Vitamin B12 deficiency anemia, unspecified: Secondary | ICD-10-CM | POA: Diagnosis not present

## 2015-10-21 DIAGNOSIS — G40909 Epilepsy, unspecified, not intractable, without status epilepticus: Secondary | ICD-10-CM

## 2015-10-21 DIAGNOSIS — E039 Hypothyroidism, unspecified: Secondary | ICD-10-CM | POA: Diagnosis not present

## 2015-10-21 NOTE — Progress Notes (Signed)
Patient ID: Lauren Burch, female   DOB: 07/06/1936, 80 y.o.   MRN: [id number]      [facility]  Chief Complaint  Patient presents with  . Medical Management of Chronic Issues    Routine Visit    Code Status: DNR  Allergies: lasix, latex  HPI 80 y/o female patient is seen today for routine visit. She denies any concerns. No fall reported. No pressure ulcer. No behavioral concerns.   Review of Systems   Constitutional: Negative for fever.   HENT: Negative for congestion, sore throat.    Eyes: Negative for blurred vision, double vision and discharge. Wears glasses   Respiratory: Negative for cough, shortness of breath and wheezing.    Cardiovascular: Negative for chest pain, palpitations.  Gastrointestinal: Negative for heartburn, nausea, vomiting, abdominal pain. Genitourinary: Negative for dysuria.   Musculoskeletal: Negative for back pain. Uses a wheelchair for now Skin: Negative for itching and rash.   Neurological: Negative for dizziness.   Psychiatric/Behavioral: Negative for depression.  Past medical history reviewed    Medication List       This list is accurate as of: 10/21/15  3:31 PM.  Always use your most recent med list.               CALTRATE 600+D 600-400 MG-UNIT tablet  Generic drug:  Calcium Carbonate-Vitamin D  Take 1 tablet by mouth 2 (two) times daily. For osteoporosis     cholecalciferol 1000 units tablet  Commonly known as:  VITAMIN D  Take 1,000 Units by mouth daily. Take 1 tablet by mouth daily for senile osteoporosis     clobetasol cream 0.05 %  Commonly known as:  TEMOVATE  Apply 1 application topically 2 (two) times daily. Apply topically around clitoral hood and labia minora twice daily     clopidogrel 75 MG tablet  Commonly known as:  PLAVIX  Take 75 mg by mouth daily. Take 1 tablet by mouth daily for CVA. Take with meals     docusate sodium 50 MG capsule  Commonly known as:  COLACE  Take 50 mg by mouth daily. Every  morning     escitalopram 10 MG tablet  Commonly known as:  LEXAPRO  Take 10 mg by mouth daily.     fluconazole 200 MG tablet  Commonly known as:  DIFLUCAN  1 by mouth once weekly (Saturday) for vulvovaginitis     gabapentin 400 MG capsule  Commonly known as:  NEURONTIN  Take 400 mg by mouth at bedtime.     gabapentin 100 MG capsule  Commonly known as:  NEURONTIN  Take 100 mg by mouth daily. In the morning     levothyroxine 25 MCG tablet  Commonly known as:  SYNTHROID, LEVOTHROID  Take 25 mcg by mouth daily.     MYRBETRIQ 25 MG Tb24 tablet  Generic drug:  mirabegron ER  Take 25 mg by mouth daily.     NAMENDA XR 28 MG Cp24 24 hr capsule  Generic drug:  memantine  Take 28 mg by mouth daily.     nitroGLYCERIN 0.4 MG SL tablet  Commonly known as:  NITROSTAT  Place 0.4 mg under the tongue every 5 (five) minutes as needed for chest pain (repeat for 3 doses prn).     nystatin-triamcinolone cream  Commonly known as:  MYCOLOG II  Apply 1 application topically 2 (two) times daily. Apply topically around clitoral hood and labia minora daily     PHENobarbital 64.8 MG  tablet  Commonly known as:  LUMINAL  Take one tablet by mouth twice daily for seizures     phenytoin 100 MG ER capsule  Commonly known as:  DILANTIN  Take 100 mg by mouth 2 (two) times daily. Give 2 capsules= 200 mg by mouth 2 times daily     polyethylene glycol packet  Commonly known as:  MIRALAX / GLYCOLAX  Take 17 g by mouth 2 (two) times daily. Mix 17 gm with 4-8 oz of liquid daily for constipation.     ranitidine 75 MG tablet  Commonly known as:  ZANTAC  Take 75 mg by mouth daily as needed for heartburn.     sennosides-docusate sodium 8.6-50 MG tablet  Commonly known as:  SENOKOT-S  Take 1 tablet by mouth at bedtime.     SYSTANE BALANCE 0.6 % Soln  Generic drug:  Propylene Glycol  Apply 1 drop to eye 2 (two) times daily. Instill 1 drop each eye twice daily for dry eyes     UNABLE TO FIND  Med Name:  Premarin Vaginal Cream- Apply cream topically via vagina twice a weekly on Sat & Wed for atrophic vaginitis     UNABLE TO FIND  Med Name: Tucks Medica Pad Cooling. Apply 1 pad to anus up to 6 times a day for pain. May leave at bedside     vitamin B-12 500 MCG tablet  Commonly known as:  CYANOCOBALAMIN  Take 500 mcg by mouth daily.        Physical exam BP 125/72 mmHg  Pulse 79  Temp(Src) 98.4 F (36.9 C) (Oral)  Resp 18  Ht  (1.6 m)  Wt 187 lb 12.8 oz (85.186 kg)  BMI 33.28 kg/m2  SpO2 95%  Wt Readings from Last 3 Encounters:  10/21/15 187 lb 12.8 oz (85.186 kg)  03/21/15 185 lb (83.915 kg)  01/27/15 184 lb 6.4 oz (83.643 kg)    General- elderly female, obese, in no acute distress Head- atraumatic, normocephalic Eyes- PERRLA, EOMI, no pallor, no icterus, no discharge Neck- no lymphadenopathy, no thyromegaly, no JVD Mouth- normal mucus membrane, dentures present Cardiovascular- normal s1,s2, no murmurs Respiratory- bilateral clear to auscultation, no wheeze, no rhonchi, no crackles Abdomen- bowel sounds present, soft, non tender Musculoskeletal- able to move all 4 extremities, trace leg edema, chronic left sided weakness, uses a wheelchair Neurological- left sided weakness from old cva Psychiatry- alert and oriented to person, place and time, normal mood and affect Skin- warm and dry  Labs-  CBC Latest Ref Rng 05/12/2015 01/25/2015 12/28/2014  WBC - 3.7 4.4 3.9  Hemoglobin 12.0 - 16.0 g/dL 16.1 11.9(A) 11.0(A)  Hematocrit 36 - 46 % 40 35(A) 33(A)  Platelets 150 - 399 K/L 185 214 197   CMP Latest Ref Rng 10/19/2015 05/12/2015 01/25/2015  Glucose 70 - 99 mg/dL - - -  BUN 4 - 21 mg/dL Creatinine 0.5 - 1.1 mg/dL 0.6 0.7 0.5  Sodium 096 - 147 mmol/L 138 139 137  Potassium 3.4 - 5.3 mmol/L 4.7 4.3 4.3  Chloride 96 - 112 mEq/L - - -  CO2 19 - 32 mEq/L - - -  Calcium 8.4 - 10.5 mg/dL - - -  Alkaline Phos 25 - 125 U/L 91 102 82  AST 13 - 35 U/L ALT 7  - 35 U/L Lab Results  Component Value Date   HGBA1C 5.4 10/19/2015   Lipid Panel  Component Value Date/Time   CHOL 174 10/19/2015   TRIG 87 10/19/2015   HDL 64 10/19/2015   CHOLHDL 2.7 07/25/2010 0036   VLDL 21 07/25/2010 0036   LDLCALC 92 10/19/2015      Assessment/plan  Constipation Stable, continue senokot s daily and miralax prn. Discontinue colace.  b12 deficiency  Continue b12 500 mcg daily  Seizure disorder continue dilantin 200 mg bid and phenobarbitol 64.8 mg bid, remains seizure free  Hypothyroidism Lab Results  Component Value Date   TSH 4.92 10/19/2015  stable, levothyroxine 25 mcg daily for now  Recurrent vulvovaginitis On chronic dosing of fluconazole 200 mg weekly, stable  Vascular dementia With CVA, HTN. Continue plavix and namenda. LDL at goal. a1c reviewed.    Oneal Grout, MD  [facility] [phone] (Monday-Friday 8 am - 5 pm) [phone] (afterhours)

## 2015-11-18 ENCOUNTER — Non-Acute Institutional Stay (SKILLED_NURSING_FACILITY): Payer: Medicare Other | Admitting: Internal Medicine

## 2015-11-18 ENCOUNTER — Encounter: Payer: Self-pay | Admitting: Internal Medicine

## 2015-11-18 DIAGNOSIS — M810 Age-related osteoporosis without current pathological fracture: Secondary | ICD-10-CM

## 2015-11-18 DIAGNOSIS — G629 Polyneuropathy, unspecified: Secondary | ICD-10-CM

## 2015-11-18 DIAGNOSIS — K5909 Other constipation: Secondary | ICD-10-CM | POA: Insufficient documentation

## 2015-11-18 DIAGNOSIS — I6990 Unspecified sequelae of unspecified cerebrovascular disease: Secondary | ICD-10-CM | POA: Diagnosis not present

## 2015-11-18 DIAGNOSIS — E785 Hyperlipidemia, unspecified: Secondary | ICD-10-CM | POA: Diagnosis not present

## 2015-11-18 DIAGNOSIS — K219 Gastro-esophageal reflux disease without esophagitis: Secondary | ICD-10-CM | POA: Diagnosis not present

## 2015-11-18 DIAGNOSIS — R3981 Functional urinary incontinence: Secondary | ICD-10-CM | POA: Diagnosis not present

## 2015-11-18 DIAGNOSIS — Z1211 Encounter for screening for malignant neoplasm of colon: Secondary | ICD-10-CM

## 2015-11-18 DIAGNOSIS — M818 Other osteoporosis without current pathological fracture: Secondary | ICD-10-CM | POA: Diagnosis not present

## 2015-11-18 DIAGNOSIS — J302 Other seasonal allergic rhinitis: Secondary | ICD-10-CM

## 2015-11-18 DIAGNOSIS — E039 Hypothyroidism, unspecified: Secondary | ICD-10-CM

## 2015-11-18 DIAGNOSIS — K5900 Constipation, unspecified: Secondary | ICD-10-CM | POA: Diagnosis not present

## 2015-11-18 DIAGNOSIS — F0150 Vascular dementia without behavioral disturbance: Secondary | ICD-10-CM

## 2015-11-18 DIAGNOSIS — F330 Major depressive disorder, recurrent, mild: Secondary | ICD-10-CM | POA: Diagnosis not present

## 2015-11-18 DIAGNOSIS — Z0000 Encounter for general adult medical examination without abnormal findings: Secondary | ICD-10-CM | POA: Diagnosis not present

## 2015-11-18 DIAGNOSIS — I25119 Atherosclerotic heart disease of native coronary artery with unspecified angina pectoris: Secondary | ICD-10-CM

## 2015-11-18 NOTE — Progress Notes (Signed)
Patient ID: Lauren Burch, female   DOB: [birth year], 80 y.o.   MRN: [id number]     LOCATION: [facility]  [facility], [facility], [place]  Allergies  Allergen Reactions  . Lasix [Furosemide]   . Latex     CODE STATUS: DNR  GOALS OF CARE: Advanced Directives 08/15/2015  Does patient have an advance directive? Yes  Type of Advance Directive Out of facility DNR (pink MOST or yellow form)  Does patient want to make changes to advanced directive? No - Patient declined  Copy of advanced directive(s) in chart? Yes     Chief Complaint  Patient presents with  . Annual Exam    Annual Visit    HPI 80 y.o. female seen today for annual wellness exam. She has been at her baseline. She complaints of constipation. She had a fall 2 months back. She uses a walker to get around. She wears her glasses. No new skin concern from wound nurse. No acute behavior changes reported. She is followed by psychiatry services. She is uptodate with her foot exam. No new concern from nursing staff.   Immunization History  Administered Date(s) Administered  . Influenza-Unspecified 06/04/2013, 06/13/2014, 08/08/2015  . PPD Test 06/10/2010, 07/03/2011, 07/18/2012, 06/09/2013, 07/21/2014  . Pneumococcal-Unspecified 07/12/2010, 06/04/2013    Health Maintenance  Topic Date Due  . DEXA SCAN  09/10/2016 (Originally 12/07/2002)  . PNA vac Low Risk Adult (2 of 2 - PCV13) 09/10/2016 (Originally 06/04/2014)  . TETANUS/TDAP  09/10/2018 (Originally 12/06/1956)  . ZOSTAVAX  02/25/2020 (Originally 12/06/1997)  . INFLUENZA VACCINE  04/10/2016    REVIEW OF SYSTEM Constitutional: Negative for fever, chills, malaise/fatigue and diaphoresis.  HENT: Positive for allergies. Negative for hearing loss and sore throat.   Eyes: Negative for blurred vision, double vision and discharge.  Respiratory: Negative for cough, shortness of breath and wheezing.   Cardiovascular: Negative for chest pain, palpitations,  leg swelling.  Gastrointestinal: Negative for heartburn, nausea, vomiting, abdominal pain. Positive for constipation.  Genitourinary: Negative for dysuria  Musculoskeletal: Negative for back pain. Last fall 2 month back. Using a walker.  Skin: Negative for itching and rash.  Neurological: Negative for dizziness, headaches.  Psychiatric/Behavioral: Negative for depression.   Past Medical History  Diagnosis Date  . CVA (cerebral infarction)   . TBI (traumatic brain injury) (HCC)     Age 69 - Seizures  . Seizures (HCC)     History reviewed. No pertinent past surgical history.      Medication List       This list is accurate as of: 11/18/15  2:43 PM.  Always use your most recent med list.               CALTRATE 600+D 600-400 MG-UNIT tablet  Generic drug:  Calcium Carbonate-Vitamin D  Take 1 tablet by mouth 2 (two) times daily. For osteoporosis     cholecalciferol 1000 units tablet  Commonly known as:  VITAMIN D  Take 1,000 Units by mouth daily. Take 1 tablet by mouth daily for senile osteoporosis     clobetasol cream 0.05 %  Commonly known as:  TEMOVATE  Apply 1 application topically 2 (two) times daily. Reported on 11/18/2015     clopidogrel 75 MG tablet  Commonly known as:  PLAVIX  Take 75 mg by mouth daily. Take 1 tablet by mouth daily for CVA. Take with meals     escitalopram 10 MG tablet  Commonly known as:  LEXAPRO  Take 10 mg by  mouth daily.     fluconazole 200 MG tablet  Commonly known as:  DIFLUCAN  1 by mouth once weekly (Saturday) for vulvovaginitis     fluticasone 50 MCG/ACT nasal spray  Commonly known as:  FLONASE  Place 2 sprays into both nostrils daily.     gabapentin 400 MG capsule  Commonly known as:  NEURONTIN  Take 400 mg by mouth at bedtime.     gabapentin 100 MG capsule  Commonly known as:  NEURONTIN  Take 100 mg by mouth daily. In the morning     levothyroxine 25 MCG tablet  Commonly known as:  SYNTHROID, LEVOTHROID  Take 25 mcg by  mouth daily.     MYRBETRIQ 25 MG Tb24 tablet  Generic drug:  mirabegron ER  Take 25 mg by mouth daily.     NAMENDA XR 28 MG Cp24 24 hr capsule  Generic drug:  memantine  Take 28 mg by mouth daily.     nitroGLYCERIN 0.4 MG SL tablet  Commonly known as:  NITROSTAT  Place 0.4 mg under the tongue every 5 (five) minutes as needed for chest pain (repeat for 3 doses prn).     nystatin-triamcinolone cream  Commonly known as:  MYCOLOG II  Apply 1 application topically 2 (two) times daily. Apply topically around clitoral hood and labia minora daily     PHENobarbital 64.8 MG tablet  Commonly known as:  LUMINAL  Take one tablet by mouth twice daily for seizures     phenytoin 100 MG ER capsule  Commonly known as:  DILANTIN  Take 100 mg by mouth 2 (two) times daily. Give 2 capsules= 200 mg by mouth 2 times daily     polyethylene glycol packet  Commonly known as:  MIRALAX / GLYCOLAX  Take 17 g by mouth 2 (two) times daily. Mix 17 gm with 4-8 oz of liquid daily for constipation.     ranitidine 75 MG tablet  Commonly known as:  ZANTAC  Take 75 mg by mouth daily as needed for heartburn.     sennosides-docusate sodium 8.6-50 MG tablet  Commonly known as:  SENOKOT-S  Take 1 tablet by mouth at bedtime.     SYSTANE BALANCE 0.6 % Soln  Generic drug:  Propylene Glycol  Apply 1 drop to eye 2 (two) times daily. Instill 1 drop each eye twice daily for dry eyes     UNABLE TO FIND  Med Name: Premarin Vaginal Cream- Apply cream topically via vagina twice a weekly on Sat & Wed for atrophic vaginitis     UNABLE TO FIND  Med Name: Tucks Medica Pad Cooling. Apply 1 pad to anus up to 6 times a day for pain. May leave at bedside     vitamin B-12 500 MCG tablet  Commonly known as:  CYANOCOBALAMIN  Take 500 mcg by mouth daily.         PHYSICAL EXAM Filed Vitals:   11/18/15 1418  BP: 119/74  Pulse: 66  Temp: 97.7 F (36.5 C)  TempSrc: Oral  Resp: 20  Height: 5\' 3"  (1.6 m)  Weight: 186 lb  12.8 oz (84.732 kg)  SpO2: 93%    Body mass index is 33.1 kg/(m^2).    General- elderly female, obese, in no acute distress Head- atraumatic, normocephalic Eyes- PERRLA, EOMI, no pallor, no icterus, no discharge Ears- normal external ear canal Neck- no lymphadenopathy, no thyromegaly, no JVD Mouth- normal mucus membrane, dentures present Cardiovascular- normal s1,s2, no murmurs Respiratory- bilateral clear to  auscultation, no wheeze, no rhonchi, no crackles Abdomen- bowel sounds present, soft, non tender Musculoskeletal- able to move all 4 extremities, trace leg edema, chronic left sided weakness, uses a wheelchair and a walker Neurological- left sided weakness from old cva Psychiatry- alert and oriented to person, place and time, normal mood and affect Skin- warm and dry   Labs CBC Latest Ref Rng 05/12/2015 01/25/2015 12/28/2014  WBC - 3.7 4.4 3.9  Hemoglobin 12.0 - 16.0 g/dL 16.1 11.9(A) 11.0(A)  Hematocrit 36 - 46 % 40 35(A) 33(A)  Platelets 150 - 399 K/L 185 214 197   CMP Latest Ref Rng 10/19/2015 05/12/2015 01/25/2015  Glucose 70 - 99 mg/dL - - -  BUN 4 - 21 mg/dL Creatinine 0.5 - 1.1 mg/dL 0.6 0.7 0.5  Sodium 096 - 147 mmol/L 138 139 137  Potassium 3.4 - 5.3 mmol/L 4.7 4.3 4.3  Chloride 96 - 112 mEq/L - - -  CO2 19 - 32 mEq/L - - -  Calcium 8.4 - 10.5 mg/dL - - -  Alkaline Phos 25 - 125 U/L 91 102 82  AST 13 - 35 U/L ALT 7 - 35 U/L Lab Results  Component Value Date   TSH 4.92 10/19/2015   Lab Results  Component Value Date   HGBA1C 5.4 10/19/2015   Lipid Panel     Component Value Date/Time   CHOL 174 10/19/2015   TRIG 87 10/19/2015   HDL 64 10/19/2015   CHOLHDL 2.7 07/25/2010 0036   VLDL 21 07/25/2010 0036   LDLCALC 92 10/19/2015    normal phenytoin and phenobarbital level on 10/19/15   Assessment/plan  Annual exam uptodate with her immunization. Will need FOBT once a year, order this. uptodate with her eye and foot exam.  Reviewed her labs and medications.   Constipation Continue senokot qhs and change miralax to daily for now and monitor  Colon cancer screening Order FOBT once a year  Allergic rhiniits Start claritin 10 mg daily x 5 days, then daily as needed only and monitor  Hyperlipidemia LDL at goal. Currently not on statin. monitor  CAD Remains chest pain free. Continue prn nitrostat and monitor  Hypothyroidism Stable tsh continue levothyroxine  Age related osteoporosis Continue calcium and vitamin d supplement. No recent fall. Is a fall risk, so take fall precautions.   gerd Stable, continue ranitidine 75 mg daily as needed  Urinary incontinence Continue myrbetriq for now  Vascular dementia Stable, no behavioral disturbance. Continue namenda xr 28 mg daily, assistance with her ADLs, fall and pressure ulcer precautions.  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 Continue escitalopram for now  Peripheral neuropathy Stable, continue gabapentin 100 mg am and 400 mg pm and monitor   Oneal Grout, MD  [facility] [phone] (Monday-Friday 8 am - 5 pm) [phone] (afterhours)

## 2015-12-13 IMAGING — CT CT HEAD W/O CM
1 series · 16 of 30 positions shown, 20 images · non-contrast
Comparison: CT 07/24/2010.  MRI 07/25/2010.

CLINICAL DATA: Multiple falls recently 2 weeks ago, hitting
temporal area. History of CVA 5 years ago with left hemiparesis.
Altered mental status.

EXAM:
CT HEAD WITHOUT CONTRAST
TECHNIQUE: Contiguous axial images were obtained from the base of the skull
through the vertex without intravenous contrast.

[Series 2: headseq 4.8 h45s · axial · 0.43mm/px · z∈[+1276,+1405]mm · 16 of 30 slices shown, 20 images]
[im 2/30  brain]
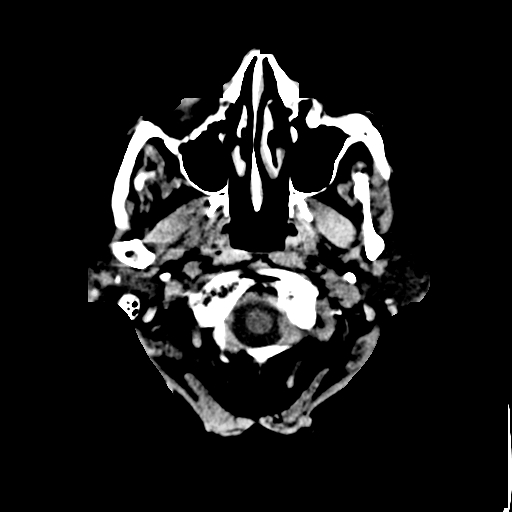
[im 2/30  bone]
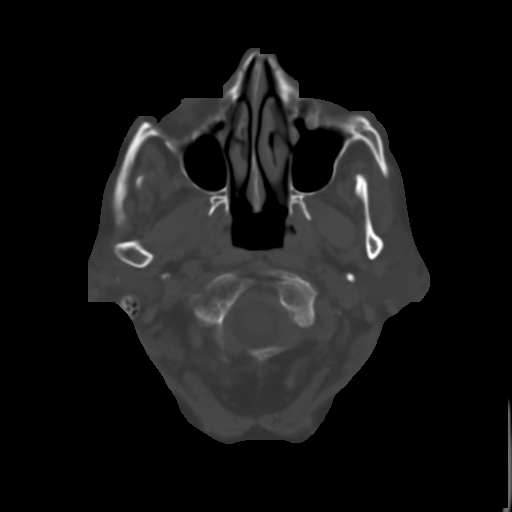
[im 4/30  brain]
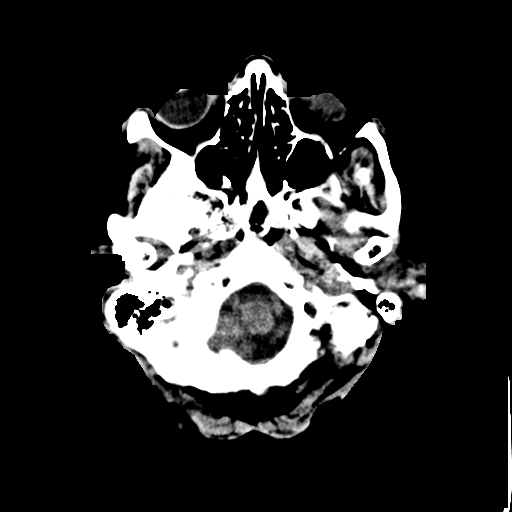
[im 6/30  brain]
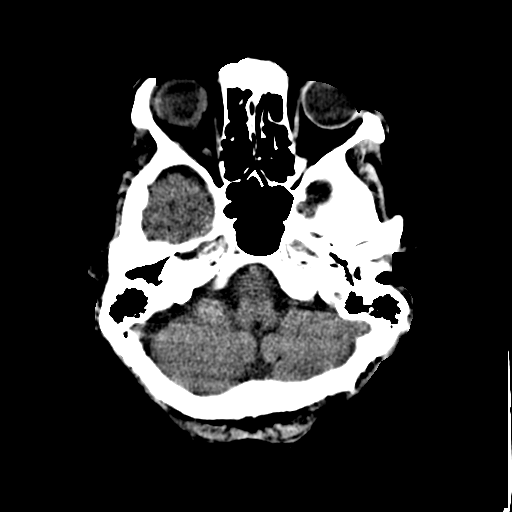
[im 8/30  brain]
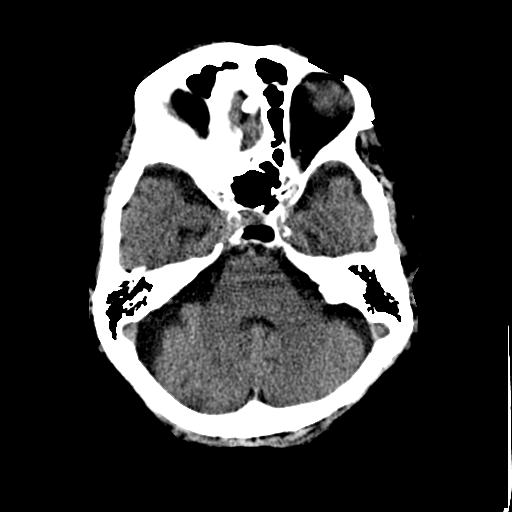
[im 9/30  brain]
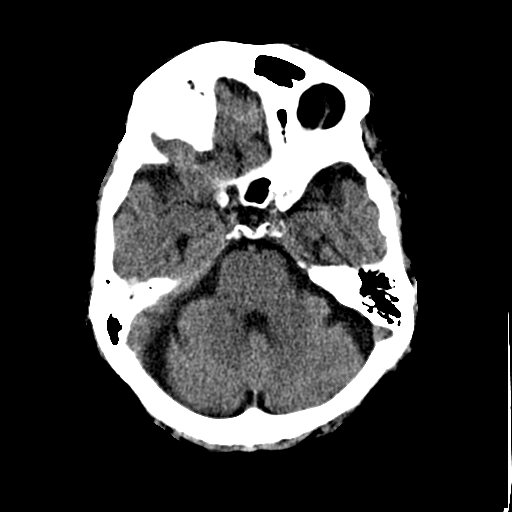
[im 9/30  bone]
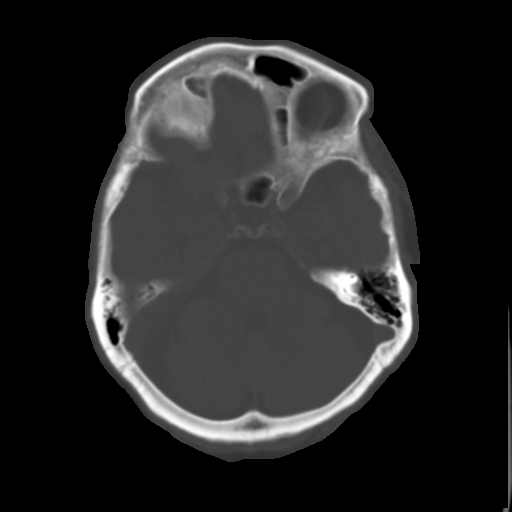
[im 11/30  brain]
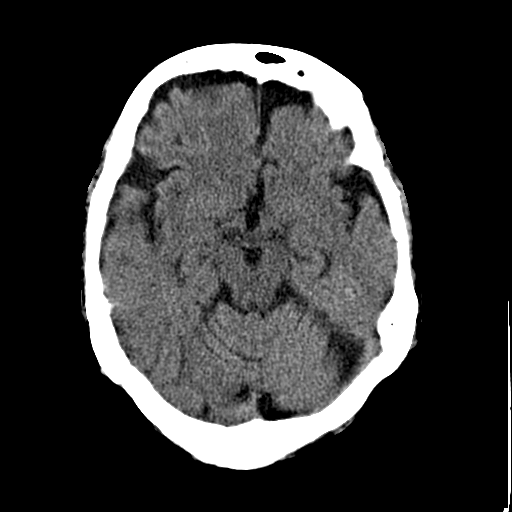
[im 13/30  brain]
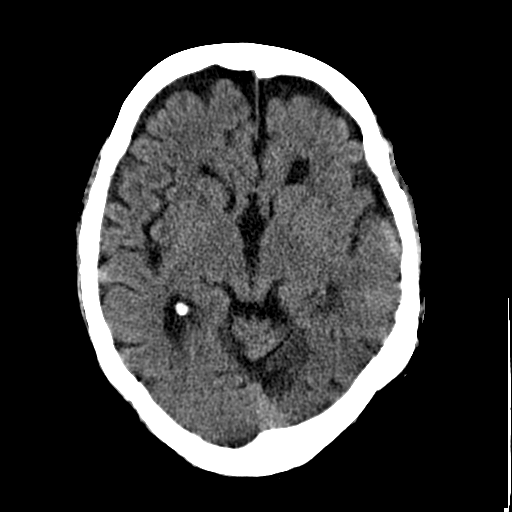
[im 15/30  brain]
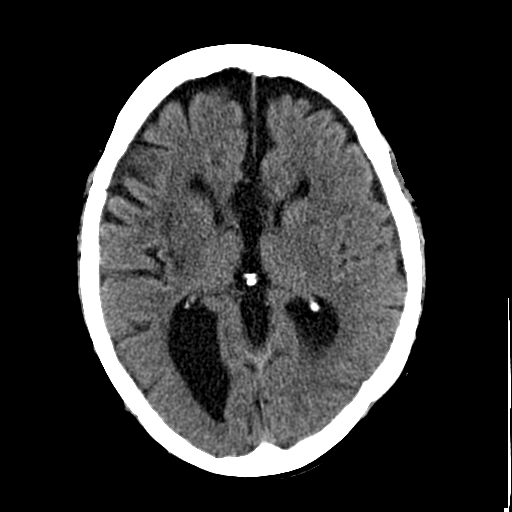
[im 16/30  brain]
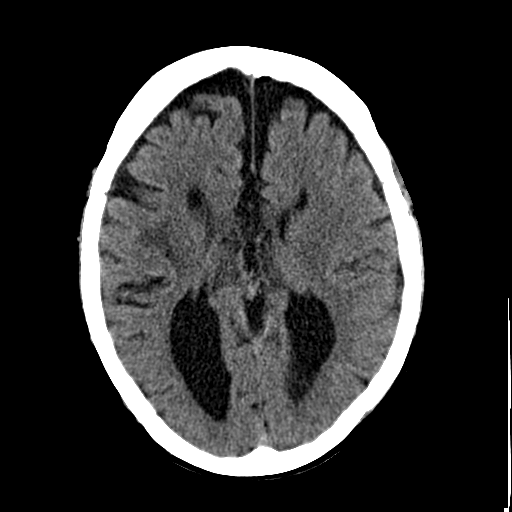
[im 16/30  bone]
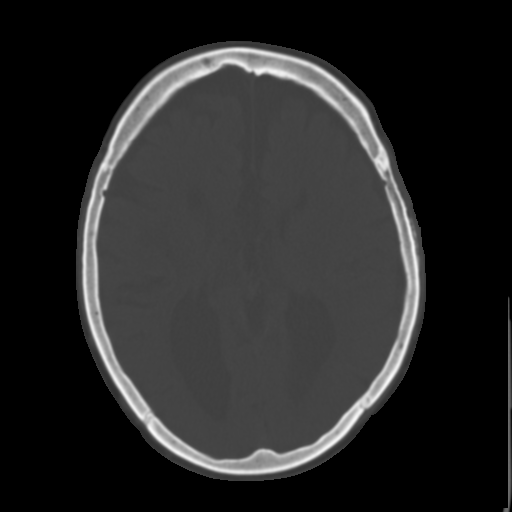
[im 18/30  brain]
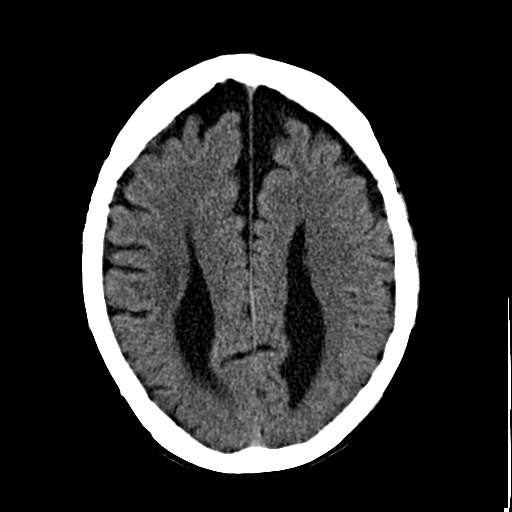
[im 20/30  brain]
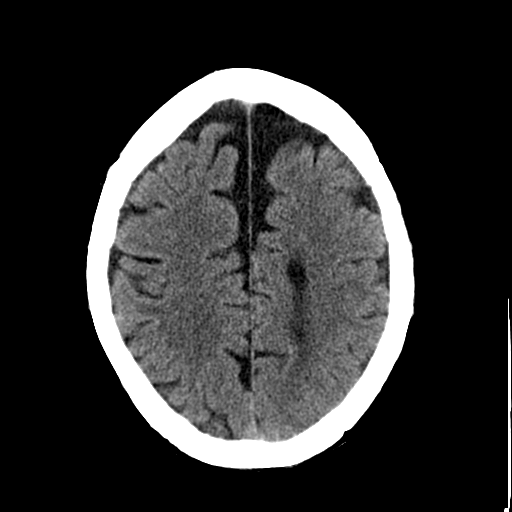
[im 22/30  brain]
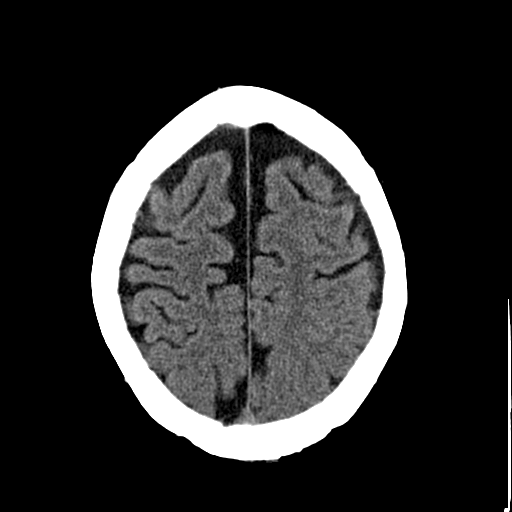
[im 23/30  brain]
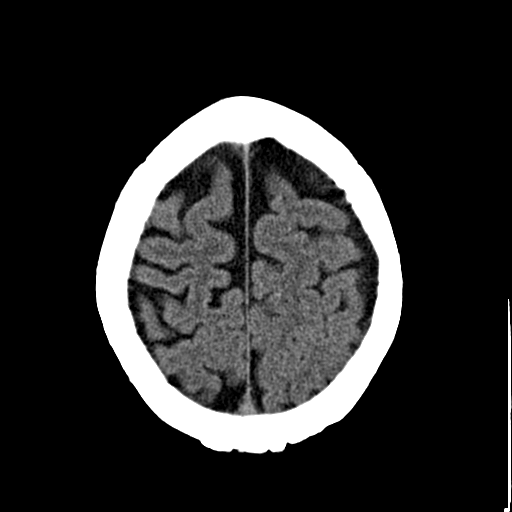
[im 23/30  bone]
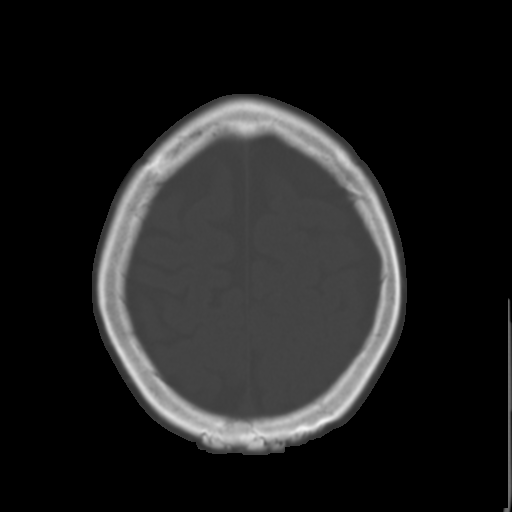
[im 25/30  brain]
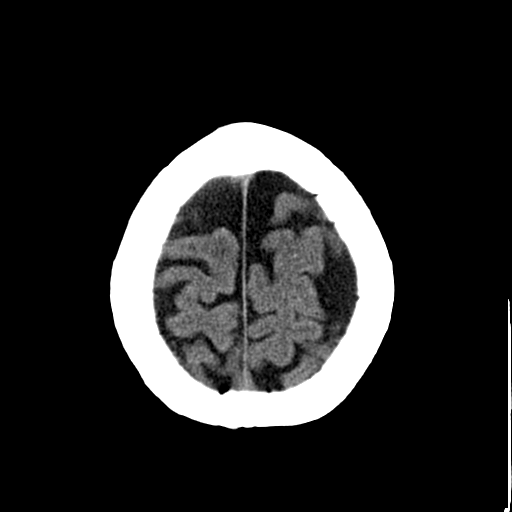
[im 27/30  brain]
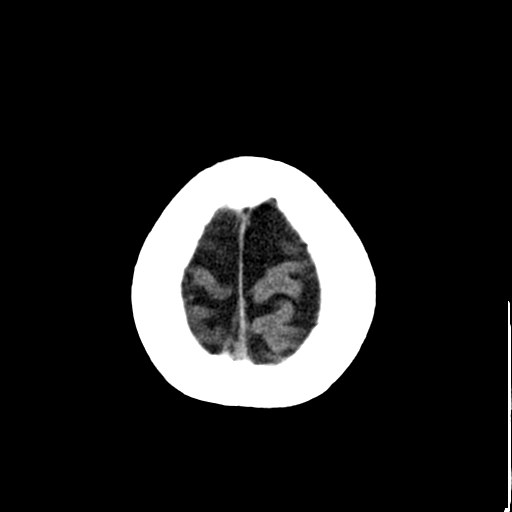
[im 29/30  brain]
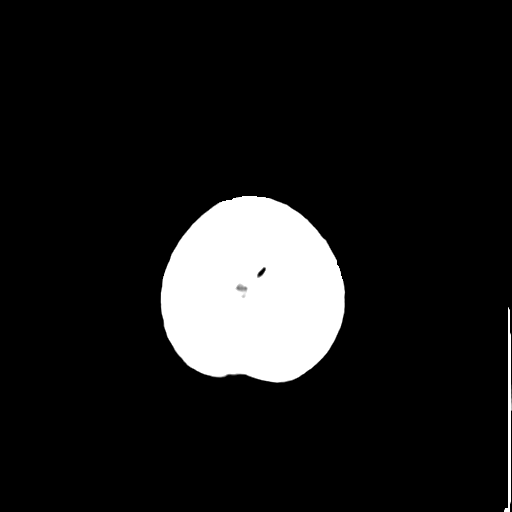

[16 of 30 positions shown; findings below may reference images not displayed]

FINDINGS: There is agenesis of the corpus callosum. There is atrophy and
chronic small vessel disease changes. No acute intracranial
abnormality. Specifically, no hemorrhage, hydrocephalus, mass
lesion, acute infarction, or significant intracranial injury. No
acute calvarial abnormality. Visualized paranasal sinuses and
mastoids clear. Orbital soft tissues unremarkable.
IMPRESSION: No acute intracranial abnormality.

## 2015-12-14 LAB — BASIC METABOLIC PANEL
BUN: 13 mg/dL (ref 4–21)
Creatinine: 0.6 mg/dL (ref 0.5–1.1)
GLUCOSE: 92 mg/dL
Potassium: 4.5 mmol/L (ref 3.4–5.3)
Sodium: 137 mmol/L (ref 137–147)

## 2015-12-14 LAB — CBC AND DIFFERENTIAL
HEMATOCRIT: 37 % (ref 36–46)
HEMOGLOBIN: 11.7 g/dL — AB (ref 12.0–16.0)
PLATELETS: 199 10*3/uL (ref 150–399)
WBC: 5.7 10*3/mL

## 2015-12-14 LAB — HEPATIC FUNCTION PANEL
ALT: 20 U/L (ref 7–35)
AST: 19 U/L (ref 13–35)
Alkaline Phosphatase: 102 U/L (ref 25–125)
BILIRUBIN, TOTAL: 0.4 mg/dL

## 2015-12-20 ENCOUNTER — Non-Acute Institutional Stay (SKILLED_NURSING_FACILITY): Payer: Medicare Other | Admitting: Internal Medicine

## 2015-12-20 ENCOUNTER — Encounter: Payer: Self-pay | Admitting: Internal Medicine

## 2015-12-20 DIAGNOSIS — K219 Gastro-esophageal reflux disease without esophagitis: Secondary | ICD-10-CM

## 2015-12-20 DIAGNOSIS — N760 Acute vaginitis: Secondary | ICD-10-CM

## 2015-12-20 DIAGNOSIS — N951 Menopausal and female climacteric states: Secondary | ICD-10-CM | POA: Diagnosis not present

## 2015-12-20 DIAGNOSIS — K5909 Other constipation: Secondary | ICD-10-CM

## 2015-12-20 DIAGNOSIS — K5900 Constipation, unspecified: Secondary | ICD-10-CM | POA: Diagnosis not present

## 2015-12-20 NOTE — Progress Notes (Signed)
Patient ID: Lauren Burch, female   DOB: 1935/11/02, 80 y.o.   MRN: [id number]     LOCATION: [facility]  [facility], [facility], [place]  Allergies  Allergen Reactions  . Lasix [Furosemide]   . Latex     CODE STATUS: DNR  GOALS OF CARE: Advanced Directives 08/15/2015  Does patient have an advance directive? Yes  Type of Advance Directive Out of facility DNR (pink MOST or yellow form)  Does patient want to make changes to advanced directive? No - Patient declined  Copy of advanced directive(s) in chart? Yes     Chief Complaint  Patient presents with  . Medical Management of Chronic Issues    Routine Visit    HPI 80 y.o. female seen today for routine visit. She has been at her baseline. She complaints of itching and dryness to her vagina. Denies any urinary complaints. Denies any vaginal discharge or vaginal bleed. No new concern from nursing staff.    REVIEW OF SYSTEM Constitutional: Negative for fever, diaphoresis.  Eyes: Negative for blurred vision, double vision and discharge.  Respiratory: Negative for cough, shortness of breath Cardiovascular: Negative for chest pain, palpitations, leg swelling.  Gastrointestinal: Negative for heartburn, nausea, vomiting, abdominal pain.  Genitourinary: Negative for dysuria  Musculoskeletal: Negative for back pain. Using a walker.  Skin: Negative for itching and rash.  Neurological: Negative for dizziness   Past Medical History  Diagnosis Date  . CVA (cerebral infarction)   . TBI (traumatic brain injury) (HCC)     Age 67 - Seizures  . Seizures (HCC)     No past surgical history on file.      Medication List       This list is accurate as of: 12/20/15  4:32 PM.  Always use your most recent med list.               CALTRATE 600+D 600-400 MG-UNIT tablet  Generic drug:  Calcium Carbonate-Vitamin D  Take 1 tablet by mouth 2 (two) times daily. For osteoporosis     cholecalciferol 1000 units tablet   Commonly known as:  VITAMIN D  Take 1,000 Units by mouth daily. Take 1 tablet by mouth daily for senile osteoporosis     clobetasol cream 0.05 %  Commonly known as:  TEMOVATE  Apply 1 application topically 2 (two) times daily. Reported on 11/18/2015     clopidogrel 75 MG tablet  Commonly known as:  PLAVIX  Take 75 mg by mouth daily. Take 1 tablet by mouth daily for CVA. Take with meals     escitalopram 10 MG tablet  Commonly known as:  LEXAPRO  Take 10 mg by mouth daily.     fluconazole 200 MG tablet  Commonly known as:  DIFLUCAN  1 by mouth once weekly (Saturday) for vulvovaginitis     fluticasone 50 MCG/ACT nasal spray  Commonly known as:  FLONASE  Place 2 sprays into both nostrils daily.     gabapentin 400 MG capsule  Commonly known as:  NEURONTIN  Take 400 mg by mouth at bedtime.     gabapentin 100 MG capsule  Commonly known as:  NEURONTIN  Take 100 mg by mouth daily. In the morning     levothyroxine 25 MCG tablet  Commonly known as:  SYNTHROID, LEVOTHROID  Take 25 mcg by mouth daily.     loratadine 10 MG tablet  Commonly known as:  CLARITIN  Take 10 mg by mouth daily as needed for  allergies.     MYRBETRIQ 25 MG Tb24 tablet  Generic drug:  mirabegron ER  Take 25 mg by mouth daily.     NAMENDA XR 28 MG Cp24 24 hr capsule  Generic drug:  memantine  Take 28 mg by mouth daily.     nitroGLYCERIN 0.4 MG SL tablet  Commonly known as:  NITROSTAT  Place 0.4 mg under the tongue every 5 (five) minutes as needed for chest pain (repeat for 3 doses prn).     nystatin-triamcinolone cream  Commonly known as:  MYCOLOG II  Apply 1 application topically 2 (two) times daily. Apply topically around clitoral hood and labia minora daily     PHENobarbital 64.8 MG tablet  Commonly known as:  LUMINAL  Take one tablet by mouth twice daily for seizures     phenytoin 100 MG ER capsule  Commonly known as:  DILANTIN  Take 100 mg by mouth 2 (two) times daily. Give 2 capsules= 200 mg  by mouth 2 times daily     polyethylene glycol packet  Commonly known as:  MIRALAX / GLYCOLAX  Take 17 g by mouth daily as needed. Mix 17 gm with 4-8 oz of liquid daily for constipation.     ranitidine 75 MG tablet  Commonly known as:  ZANTAC  Take 75 mg by mouth daily as needed for heartburn.     sennosides-docusate sodium 8.6-50 MG tablet  Commonly known as:  SENOKOT-S  Take 1 tablet by mouth at bedtime.     SYSTANE BALANCE 0.6 % Soln  Generic drug:  Propylene Glycol  Apply 1 drop to eye 2 (two) times daily. Instill 1 drop each eye twice daily for dry eyes     UNABLE TO FIND  Med Name: Premarin Vaginal Cream- Apply cream topically via vagina twice a weekly on Sat & Wed for atrophic vaginitis     UNABLE TO FIND  Med Name: Tucks Medica Pad Cooling. Apply 1 pad to anus up to 6 times a day for pain. May leave at bedside     vitamin B-12 500 MCG tablet  Commonly known as:  CYANOCOBALAMIN  Take 500 mcg by mouth daily.         PHYSICAL EXAM Filed Vitals:   12/20/15 1624  BP: 135/75  Pulse: 76  Temp: 99.2 F (37.3 C)  TempSrc: Oral  Resp: 18  Height: 5\' 3"  (1.6 m)  Weight: 187 lb 9.6 oz (85.095 kg)  SpO2: 89%    Body mass index is 33.24 kg/(m^2).    General- elderly female, obese, in no acute distress Head- atraumatic, normocephalic Eyes- PERRLA, EOMI, no pallor, no icterus, no discharge Neck- no lymphadenopathy Mouth- normal mucus membrane, dentures present Cardiovascular- normal s1,s2, no murmurs Respiratory- bilateral clear to auscultation, no wheeze, no rhonchi, no crackles Abdomen- bowel sounds present, soft, non tender Musculoskeletal- able to move all 4 extremities, trace leg edema, chronic left sided weakness, uses a wheelchair and a walker Neurological- left sided weakness  Psychiatry- alert and oriented to person, place and time, normal mood and affect Skin- warm and dry   Labs CBC Latest Ref Rng 12/14/2015 05/12/2015 01/25/2015  WBC - 5.7 3.7 4.4    Hemoglobin 12.0 - 16.0 g/dL 11.7(A) 12.7 11.9(A)  Hematocrit 36 - 46 % 37 40 35(A)  Platelets 150 - 399 K/L 199 185 214   CMP Latest Ref Rng 12/14/2015 10/19/2015 05/12/2015  Glucose 70 - 99 mg/dL - - -  BUN 4 - 21 mg/dL 13  14 13  Creatinine 0.5 - 1.1 mg/dL 0.6 0.6 0.7  Sodium 454 - 147 mmol/L 137 138 139  Potassium 3.4 - 5.3 mmol/L 4.5 4.7 4.3  Chloride 96 - 112 mEq/L - - -  CO2 19 - 32 mEq/L - - -  Calcium 8.4 - 10.5 mg/dL - - -  Alkaline Phos 25 - 125 U/L 102 91 102  AST 13 - 35 U/L ALT 7 - 35 U/L Lab Results  Component Value Date   TSH 4.92 10/19/2015   Lab Results  Component Value Date   HGBA1C 5.4 10/19/2015   Lipid Panel     Component Value Date/Time   CHOL 174 10/19/2015   TRIG 87 10/19/2015   HDL 64 10/19/2015   CHOLHDL 2.7 07/25/2010 0036   VLDL 21 07/25/2010 0036   LDLCALC 92 10/19/2015    normal phenytoin and phenobarbital level on 10/19/15   Assessment/plan  Vaginal dryness Likely from vaginal atrophy with estrogen deficiency. Will start premarin vaginal cream three times a week for a month and reassess  Constipation Continue senokot qhs and miralax daily for now and monitor  gerd Stable, continue ranitidine 75 mg daily as needed  Vulvovaginitis On weekly fluconazole regimen, monitor clinically    Oneal Grout, MD  [facility] [phone] (Monday-Friday 8 am - 5 pm) [phone] (afterhours)

## 2015-12-26 LAB — HEPATIC FUNCTION PANEL
ALK PHOS: 87 U/L (ref 25–125)
ALT: 16 U/L (ref 7–35)
AST: 16 U/L (ref 13–35)
BILIRUBIN, TOTAL: 0.3 mg/dL

## 2015-12-26 LAB — BASIC METABOLIC PANEL
BUN: 11 mg/dL (ref 4–21)
CREATININE: 0.5 mg/dL (ref 0.5–1.1)
GLUCOSE: 91 mg/dL
Potassium: 4.2 mmol/L (ref 3.4–5.3)
Sodium: 137 mmol/L (ref 137–147)

## 2016-01-12 ENCOUNTER — Other Ambulatory Visit: Payer: Self-pay

## 2016-01-12 MED ORDER — PHENOBARBITAL 64.8 MG PO TABS: ORAL_TABLET | ORAL | Status: AC

## 2016-01-12 NOTE — Telephone Encounter (Signed)
[facility]  [address] [place] [address]  Phone: [phone]  Fax: [phone]  

## 2016-01-30 ENCOUNTER — Other Ambulatory Visit: Payer: Self-pay

## 2016-01-30 DIAGNOSIS — Z1231 Encounter for screening mammogram for malignant neoplasm of breast: Secondary | ICD-10-CM

## 2016-02-08 ENCOUNTER — Non-Acute Institutional Stay (SKILLED_NURSING_FACILITY): Payer: Medicare Other | Admitting: Internal Medicine

## 2016-02-08 ENCOUNTER — Encounter: Payer: Self-pay | Admitting: Internal Medicine

## 2016-02-08 DIAGNOSIS — K219 Gastro-esophageal reflux disease without esophagitis: Secondary | ICD-10-CM

## 2016-02-08 DIAGNOSIS — F329 Major depressive disorder, single episode, unspecified: Secondary | ICD-10-CM | POA: Diagnosis not present

## 2016-02-08 DIAGNOSIS — G40909 Epilepsy, unspecified, not intractable, without status epilepticus: Secondary | ICD-10-CM

## 2016-02-08 NOTE — Progress Notes (Signed)
Patient ID: Lauren Burch, female   DOB: 30-Apr-1936, 80 y.o.   MRN: [id number]     LOCATION: [facility]  [facility], [facility], MD  Allergies  Allergen Reactions  . Lasix [Furosemide]   . Latex     CODE STATUS: DNR  GOALS OF CARE: Advanced Directives 08/15/2015  Does patient have an advance directive? Yes  Type of Advance Directive Out of facility DNR (pink MOST or yellow form)  Does patient want to make changes to advanced directive? No - Patient declined  Copy of advanced directive(s) in chart? Yes     Chief Complaint  Patient presents with  . Medical Management of Chronic Issues    Routine Visit    HPI 80 y.o. female seen today for routine visit. She has been at her baseline. Denies any concern this visit.   REVIEW OF SYSTEM Constitutional: Negative for fever Eyes: Negative for blurred vision, double vision and discharge.  Respiratory: Negative for cough, shortness of breath Cardiovascular: Negative for chest pain, palpitations, leg swelling.  Gastrointestinal: Negative for heartburn, nausea, vomiting, abdominal pain.  Genitourinary: Negative for dysuria  Musculoskeletal: Negative for back pain. Using a walker and wheelchair Skin: Negative for itching and rash.  Neurological: Negative for dizziness   Past Medical History  Diagnosis Date  . CVA (cerebral infarction)   . TBI (traumatic brain injury) (HCC)     Age 80 - Seizures  . Seizures (HCC)     No past surgical history on file.      Medication List       This list is accurate as of: 02/08/16  4:37 PM.  Always use your most recent med list.               CALTRATE 600+D 600-400 MG-UNIT tablet  Generic drug:  Calcium Carbonate-Vitamin D  Take 1 tablet by mouth 2 (two) times daily. For osteoporosis     cholecalciferol 1000 units tablet  Commonly known as:  VITAMIN D  Take 1,000 Units by mouth daily. Take 1 tablet by mouth daily for senile osteoporosis     clobetasol  cream 0.05 %  Commonly known as:  TEMOVATE  Apply 1 application topically 2 (two) times daily. Reported on 11/18/2015     clopidogrel 75 MG tablet  Commonly known as:  PLAVIX  Take 75 mg by mouth daily. Take 1 tablet by mouth daily for CVA. Take with meals     escitalopram 10 MG tablet  Commonly known as:  LEXAPRO  Take 10 mg by mouth daily.     fluconazole 200 MG tablet  Commonly known as:  DIFLUCAN  1 by mouth once weekly (Saturday) for vulvovaginitis     fluticasone 50 MCG/ACT nasal spray  Commonly known as:  FLONASE  Place 2 sprays into both nostrils daily.     gabapentin 400 MG capsule  Commonly known as:  NEURONTIN  Take 400 mg by mouth at bedtime.     gabapentin 100 MG capsule  Commonly known as:  NEURONTIN  Take 100 mg by mouth daily. In the morning     levothyroxine 25 MCG tablet  Commonly known as:  SYNTHROID, LEVOTHROID  Take 25 mcg by mouth daily.     loratadine 10 MG tablet  Commonly known as:  CLARITIN  Take 10 mg by mouth daily as needed for allergies.     MYRBETRIQ 25 MG Tb24 tablet  Generic drug:  mirabegron ER  Take 25 mg by mouth daily.  NAMENDA XR 28 MG Cp24 24 hr capsule  Generic drug:  memantine  Take 28 mg by mouth daily.     nitroGLYCERIN 0.4 MG SL tablet  Commonly known as:  NITROSTAT  Place 0.4 mg under the tongue every 5 (five) minutes as needed for chest pain (repeat for 3 doses prn).     nystatin-triamcinolone cream  Commonly known as:  MYCOLOG II  Apply 1 application topically 2 (two) times daily. Apply topically around clitoral hood and labia minora daily     PHENobarbital 64.8 MG tablet  Commonly known as:  LUMINAL  Take one tablet by mouth twice daily for seizures     phenytoin 100 MG ER capsule  Commonly known as:  DILANTIN  Take 100 mg by mouth 2 (two) times daily. Give 2 capsules= 200 mg by mouth 2 times daily     polyethylene glycol packet  Commonly known as:  MIRALAX / GLYCOLAX  Take 17 g by mouth daily as needed.  Mix 17 gm with 4-8 oz of liquid daily for constipation.     ranitidine 75 MG tablet  Commonly known as:  ZANTAC  Take 75 mg by mouth daily as needed for heartburn.     sennosides-docusate sodium 8.6-50 MG tablet  Commonly known as:  SENOKOT-S  Take 1 tablet by mouth at bedtime.     SYSTANE BALANCE 0.6 % Soln  Generic drug:  Propylene Glycol  Apply 1 drop to eye 2 (two) times daily. Instill 1 drop each eye twice daily for dry eyes     UNABLE TO FIND  Med Name: Premarin Vaginal Cream- Apply cream topically via vagina twice a weekly on Sat & Wed for atrophic vaginitis     UNABLE TO FIND  Med Name: Tucks Medica Pad Cooling. Apply 1 pad to anus up to 6 times a day for pain. May leave at bedside     vitamin B-12 500 MCG tablet  Commonly known as:  CYANOCOBALAMIN  Take 500 mcg by mouth daily.         PHYSICAL EXAM Filed Vitals:   02/08/16 1631  BP: 106/62  Pulse: 64  Temp: 96.2 F (35.7 C)  TempSrc: Oral  Resp: 18  Height:  (1.6 m)  Weight: 185 lb 12.8 oz (84.278 kg)  SpO2: 94%    Body mass index is 32.92 kg/(m^2).    General- elderly female, obese, in no acute distress Head- atraumatic, normocephalic Eyes- PERRLA, EOMI, no pallor, no icterus, no discharge Neck- no lymphadenopathy Mouth- normal mucus membrane, dentures present Cardiovascular- normal s1,s2, no murmurs Respiratory- bilateral clear to auscultation, no wheeze, no rhonchi, no crackles Abdomen- bowel sounds present, soft, non tender Musculoskeletal- able to move all 4 extremities, trace leg edema, chronic left sided weakness, uses a wheelchair and a walker Neurological- left sided weakness  Psychiatry- alert and oriented to person, place and time, normal mood and affect Skin- warm and dry   Labs CBC Latest Ref Rng 12/14/2015 05/12/2015 01/25/2015  WBC - 5.7 3.7 4.4  Hemoglobin 12.0 - 16.0 g/dL 11.7(A) 12.7 11.9(A)  Hematocrit 36 - 46 % 37 40 35(A)  Platelets 150 - 399 K/L 199 185 214   CMP Latest  Ref Rng 12/26/2015 12/14/2015 10/19/2015  BUN 4 - 21 mg/dL Creatinine 0.5 - 1.1 mg/dL 0.5 0.6 0.6  Sodium 811 - 147 mmol/L 137 137 138  Potassium 3.4 - 5.3 mmol/L 4.2 4.5 4.7  Alkaline Phos 25 - 125 U/L 87  102 91  AST 13 - 35 U/L 16 19 19   ALT 7 - 35 U/L 16 20 20    Lab Results  Component Value Date   TSH 4.92 10/19/2015   Lab Results  Component Value Date   HGBA1C 5.4 10/19/2015   Lipid Panel     Component Value Date/Time   CHOL 174 10/19/2015   TRIG 87 10/19/2015   HDL 64 10/19/2015   CHOLHDL 2.7 07/25/2010 0036   VLDL 21 07/25/2010 0036   LDLCALC 92 10/19/2015    normal phenytoin and phenobarbital level on 10/19/15   Assessment/plan  Chronic depression Stable, continue lexapro 10 mg daily  Seizure disorder continue dilantin 200 mg bid and phenobarbitol 64.8 mg bid, remains seizure free  gerd Stable, continue ranitidine 75 mg daily as needed    Oneal Grout, MD  [facility] [phone] (Monday-Friday 8 am - 5 pm) [phone] (afterhours)

## 2016-02-16 LAB — CBC AND DIFFERENTIAL
HCT: 35 % — AB (ref 36–46)
Hemoglobin: 11.1 g/dL — AB (ref 12.0–16.0)
Platelets: 225 10*3/uL (ref 150–399)
WBC: 4.2 10^3/mL

## 2016-02-16 LAB — HEPATIC FUNCTION PANEL
ALK PHOS: 91 U/L (ref 25–125)
ALT: 16 U/L (ref 7–35)
AST: 15 U/L (ref 13–35)
Bilirubin, Total: 0.4 mg/dL

## 2016-02-16 LAB — BASIC METABOLIC PANEL
BUN: 13 mg/dL (ref 4–21)
CREATININE: 0.6 mg/dL (ref 0.5–1.1)
Glucose: 103 mg/dL
Potassium: 4.3 mmol/L (ref 3.4–5.3)
SODIUM: 138 mmol/L (ref 137–147)

## 2016-02-24 ENCOUNTER — Ambulatory Visit: Payer: Self-pay

## 2016-02-27 LAB — BASIC METABOLIC PANEL
BUN: 12 mg/dL (ref 4–21)
Creatinine: 0.5 mg/dL (ref 0.5–1.1)
GLUCOSE: 104 mg/dL
Potassium: 4.2 mmol/L (ref 3.4–5.3)
Sodium: 138 mmol/L (ref 137–147)

## 2016-02-27 LAB — LIPID PANEL
Cholesterol: 184 mg/dL (ref 0–200)
HDL: 58 mg/dL (ref 35–70)
LDL CALC: 109 mg/dL
Triglycerides: 86 mg/dL (ref 40–160)

## 2016-02-27 LAB — CBC AND DIFFERENTIAL
HEMATOCRIT: 33 % — AB (ref 36–46)
HEMOGLOBIN: 10.8 g/dL — AB (ref 12.0–16.0)
PLATELETS: 222 10*3/uL (ref 150–399)
WBC: 4.9 10*3/mL

## 2016-03-09 ENCOUNTER — Ambulatory Visit
Admission: RE | Admit: 2016-03-09 | Discharge: 2016-03-09 | Disposition: A | Discharge status: Home / Self Care | Payer: Medicare Other | Source: Ambulatory Visit

## 2016-03-09 DIAGNOSIS — Z1231 Encounter for screening mammogram for malignant neoplasm of breast: Secondary | ICD-10-CM

## 2016-03-09 LAB — BASIC METABOLIC PANEL
BUN: 9 mg/dL (ref 4–21)
Creatinine: 0.5 mg/dL (ref 0.5–1.1)
GLUCOSE: 97 mg/dL
POTASSIUM: 4.2 mmol/L (ref 3.4–5.3)
SODIUM: 139 mmol/L (ref 137–147)

## 2016-03-09 LAB — LIPID PANEL
CHOLESTEROL: 186 mg/dL (ref 0–200)
HDL: 62 mg/dL (ref 35–70)
LDL Cholesterol: 109 mg/dL
Triglycerides: 76 mg/dL (ref 40–160)

## 2016-03-09 LAB — TSH: TSH: 2.78 u[IU]/mL (ref 0.41–5.90)

## 2016-03-14 LAB — HEPATIC FUNCTION PANEL
ALT: 18 U/L (ref 7–35)
AST: 18 U/L (ref 13–35)
Alkaline Phosphatase: 94 U/L (ref 25–125)
Bilirubin, Total: 0.3 mg/dL

## 2016-03-20 ENCOUNTER — Encounter: Payer: Self-pay | Admitting: Internal Medicine

## 2016-03-20 ENCOUNTER — Non-Acute Institutional Stay (SKILLED_NURSING_FACILITY): Payer: Medicare Other | Admitting: Internal Medicine

## 2016-03-20 DIAGNOSIS — B9620 Unspecified Escherichia coli [E. coli] as the cause of diseases classified elsewhere: Secondary | ICD-10-CM | POA: Diagnosis not present

## 2016-03-20 DIAGNOSIS — N390 Urinary tract infection, site not specified: Secondary | ICD-10-CM

## 2016-03-20 DIAGNOSIS — Z8673 Personal history of transient ischemic attack (TIA), and cerebral infarction without residual deficits: Secondary | ICD-10-CM | POA: Insufficient documentation

## 2016-03-20 DIAGNOSIS — G40909 Epilepsy, unspecified, not intractable, without status epilepticus: Secondary | ICD-10-CM

## 2016-03-20 DIAGNOSIS — I25119 Atherosclerotic heart disease of native coronary artery with unspecified angina pectoris: Secondary | ICD-10-CM

## 2016-03-20 NOTE — Progress Notes (Signed)
Patient ID: Lauren Burch, female   DOB: [birth year], 80 y.o.   MRN: [id number]     LOCATION: [facility]  [facility], [facility]  Allergies  Allergen Reactions  . Lasix [Furosemide]   . Latex     CODE STATUS: DNR  GOALS OF CARE: Advanced Directives 03/20/2016  Does patient have an advance directive? Yes  Type of Advance Directive Out of facility DNR (pink MOST or yellow form)  Does patient want to make changes to advanced directive? No - Patient declined  Copy of advanced directive(s) in chart? Yes     Chief Complaint  Patient presents with  . Medical Management of Chronic Issues    Routine Visit    HPI 80 y.o. female seen today for routine visit. She has been at her baseline. She is currently on antibiotic rx for UTI   REVIEW OF SYSTEM Constitutional: Negative for fever Respiratory: Negative for cough, shortness of breath Cardiovascular: Negative for chest pain, palpitations Gastrointestinal: Negative for heartburn, nausea, vomiting, abdominal pain.  Genitourinary: Negative for dysuria  Musculoskeletal: Negative for back pain. Using a walker and wheelchair Skin: Negative for itching and rash.  Neurological: Negative for dizziness   Past Medical History  Diagnosis Date  . CVA (cerebral infarction)   . TBI (traumatic brain injury) (HCC)     Age 80 - Seizures  . Seizures (HCC)     No past surgical history on file.      Medication List       This list is accurate as of: 03/20/16  2:52 PM.  Always use your most recent med list.               acetaminophen 325 MG tablet  Commonly known as:  TYLENOL  Take 650 mg by mouth every 4 (four) hours as needed for mild pain.     CALTRATE 600+D 600-400 MG-UNIT tablet  Generic drug:  Calcium Carbonate-Vitamin D  Take 1 tablet by mouth 2 (two) times daily. For osteoporosis     cholecalciferol 1000 units tablet  Commonly known as:  VITAMIN D  Take 1,000 Units by mouth daily. Take 1  tablet by mouth daily for senile osteoporosis     clobetasol cream 0.05 %  Commonly known as:  TEMOVATE  Apply 1 application topically 2 (two) times daily. Reported on 11/18/2015     clopidogrel 75 MG tablet  Commonly known as:  PLAVIX  Take 75 mg by mouth daily. Take 1 tablet by mouth daily for CVA. Take with meals     escitalopram 10 MG tablet  Commonly known as:  LEXAPRO  Take 10 mg by mouth daily.     fluconazole 200 MG tablet  Commonly known as:  DIFLUCAN  1 by mouth once weekly (Saturday) for vulvovaginitis     fluticasone 50 MCG/ACT nasal spray  Commonly known as:  FLONASE  Place 2 sprays into both nostrils daily.     gabapentin 400 MG capsule  Commonly known as:  NEURONTIN  Take 400 mg by mouth at bedtime.     gabapentin 100 MG capsule  Commonly known as:  NEURONTIN  Take 100 mg by mouth daily. In the morning     lactulose 10 GM/15ML solution  Commonly known as:  CHRONULAC  Take by mouth 2 (two) times daily. Give 30 cc     levothyroxine 25 MCG tablet  Commonly known as:  SYNTHROID, LEVOTHROID  Take 25 mcg by mouth daily.  loratadine 10 MG tablet  Commonly known as:  CLARITIN  Take 10 mg by mouth daily as needed for allergies.     MYRBETRIQ 25 MG Tb24 tablet  Generic drug:  mirabegron ER  Take 25 mg by mouth daily.     NAMENDA XR 28 MG Cp24 24 hr capsule  Generic drug:  memantine  Take 28 mg by mouth daily.     nitroGLYCERIN 0.4 MG SL tablet  Commonly known as:  NITROSTAT  Place 0.4 mg under the tongue every 5 (five) minutes as needed for chest pain (repeat for 3 doses prn).     nystatin-triamcinolone cream  Commonly known as:  MYCOLOG II  Apply 1 application topically 2 (two) times daily. Apply topically around clitoral hood and labia minora daily     PHENobarbital 64.8 MG tablet  Commonly known as:  LUMINAL  Take one tablet by mouth twice daily for seizures     polyethylene glycol packet  Commonly known as:  MIRALAX / GLYCOLAX  Take 17 g by  mouth daily as needed. Mix 17 gm with 4-8 oz of liquid daily for constipation.     ranitidine 75 MG tablet  Commonly known as:  ZANTAC  Take 75 mg by mouth daily as needed for heartburn.     ROCEPHIN 1 g injection  Generic drug:  cefTRIAXone  Inject 1 g into the muscle daily. Stop date 03/25/16     sennosides-docusate sodium 8.6-50 MG tablet  Commonly known as:  SENOKOT-S  Take 1 tablet by mouth at bedtime.     SYSTANE BALANCE 0.6 % Soln  Generic drug:  Propylene Glycol  Apply 1 drop to eye 2 (two) times daily. Instill 1 drop each eye twice daily for dry eyes     traMADol 50 MG tablet  Commonly known as:  ULTRAM  Take 50 mg by mouth every 8 (eight) hours as needed for severe pain.     UNABLE TO FIND  Med Name: Premarin Vaginal Cream- Apply cream topically via vagina twice a weekly on Sat & Wed for atrophic vaginitis     UNABLE TO FIND  Med Name: Tucks Medica Pad Cooling. Apply 1 pad to anus up to 6 times a day for pain. May leave at bedside     vitamin B-12 500 MCG tablet  Commonly known as:  CYANOCOBALAMIN  Take 500 mcg by mouth daily.         PHYSICAL EXAM Filed Vitals:   03/20/16 1434  BP: 117/86  Pulse: 71  Temp: 98 F (36.7 C)  TempSrc: Oral  Resp: 20  Height: 5\' 3"  (1.6 m)  Weight: 185 lb 12.8 oz (84.278 kg)  SpO2: 97%    Body mass index is 32.92 kg/(m^2).    General- elderly female, obese, in no acute distress Head- atraumatic, normocephalic Eyes- PERRLA, EOMI, no pallor, no icterus, no discharge Neck- no lymphadenopathy Mouth- normal mucus membrane, dentures present Cardiovascular- normal s1,s2, no murmurs Respiratory- bilateral clear to auscultation, no wheeze, no rhonchi, no crackles Abdomen- bowel sounds present, soft, non tender Musculoskeletal- able to move all 4 extremities, trace leg edema, chronic left sided weakness, uses a wheelchair and a walker Neurological- left sided weakness  Psychiatry- alert and oriented to person, place and time,  normal mood and affect Skin- warm and dry   Labs CBC Latest Ref Rng 02/27/2016 02/16/2016 12/14/2015  WBC - 4.9 4.2 5.7  Hemoglobin 12.0 - 16.0 g/dL 10.8(A) 11.1(A) 11.7(A)  Hematocrit 36 - 46 %  33(A) 35(A) 37  Platelets 150 - 399 K/L 222 225 199   CMP Latest Ref Rng 03/14/2016 03/09/2016 02/27/2016  BUN 4 - 21 mg/dL - 9 12  Creatinine 0.5 - 1.1 mg/dL - 0.5 0.5  Sodium 409 - 147 mmol/L - 139 138  Potassium 3.4 - 5.3 mmol/L - 4.2 4.2  Alkaline Phos 25 - 125 U/L 94 - -  AST 13 - 35 U/L 18 - -  ALT 7 - 35 U/L 18 - -   Lab Results  Component Value Date   TSH 2.78 03/09/2016   Lab Results  Component Value Date   HGBA1C 5.4 10/19/2015   Lipid Panel     Component Value Date/Time   CHOL 186 03/09/2016   TRIG 76 03/09/2016   HDL 62 03/09/2016   CHOLHDL 2.7 07/25/2010 0036   VLDL 21 07/25/2010 0036   LDLCALC 109 03/09/2016    normal phenytoin and phenobarbital level on 10/19/15   Assessment/plan  UTI Currently on ceftriaxone 1 gm im once a week for e.coli and proteus mirabilis in her urine. Encouraged hydration and monitor  Seizure disorder Remains seizure free. Continue reduced dosing of dilantin. Continue phenobarbital   History of CVA Continue plavix, supportive care, fall precautions.  CAD Chest pain free. Continue prn NTG.    Oneal Grout, MD  [facility] Adult Medicine [phone] (Monday-Friday 8 am - 5 pm) [phone] (afterhours)

## 2016-04-25 ENCOUNTER — Non-Acute Institutional Stay (SKILLED_NURSING_FACILITY): Payer: Medicare Other | Admitting: Internal Medicine

## 2016-04-25 ENCOUNTER — Encounter: Payer: Self-pay | Admitting: Internal Medicine

## 2016-04-25 DIAGNOSIS — D519 Vitamin B12 deficiency anemia, unspecified: Secondary | ICD-10-CM

## 2016-04-25 DIAGNOSIS — N182 Chronic kidney disease, stage 2 (mild): Secondary | ICD-10-CM | POA: Insufficient documentation

## 2016-04-25 DIAGNOSIS — N189 Chronic kidney disease, unspecified: Secondary | ICD-10-CM

## 2016-04-25 DIAGNOSIS — D631 Anemia in chronic kidney disease: Secondary | ICD-10-CM | POA: Diagnosis not present

## 2016-04-25 NOTE — Progress Notes (Signed)
Patient ID: Lauren Burch, female   DOB: 1935/09/22, 80 y.o.   MRN: [id number]     LOCATION: [facility]  [facility], [facility], MD  Allergies  Allergen Reactions  . Lasix [Furosemide]   . Latex     CODE STATUS: DNR  GOALS OF CARE: Advanced Directives 03/20/2016  Does patient have an advance directive? Yes  Type of Advance Directive Out of facility DNR (pink MOST or yellow form)  Does patient want to make changes to advanced directive? No - Patient declined  Copy of advanced directive(s) in chart? Yes     Chief Complaint  Patient presents with  . Medical Management of Chronic Issues    Routine Visit    HPI 80 y.o. female seen today for routine visit. She has been at her baseline. No new concern from patient or nursing staff this visit.   REVIEW OF SYSTEM Constitutional: Negative for fever Respiratory: Negative for cough, shortness of breath Cardiovascular: Negative for chest pain, palpitations Gastrointestinal: Negative for heartburn, nausea, vomiting, abdominal pain.  Genitourinary: Negative for dysuria  Musculoskeletal: Negative for back pain. Using a walker and wheelchair Skin: Negative for itching and rash.  Neurological: Negative for dizziness   Past Medical History:  Diagnosis Date  . CVA (cerebral infarction)   . Seizures (HCC)   . TBI (traumatic brain injury) [facility](HCC)    Age 80 - Seizures    No past surgical history on file.      Medication List       Accurate as of 04/25/16  3:11 PM. Always use your most recent med list.          acetaminophen 325 MG tablet Commonly known as:  TYLENOL Take 650 mg by mouth every 4 (four) hours as needed for mild pain.   CALTRATE 600+D 600-400 MG-UNIT tablet Generic drug:  Calcium Carbonate-Vitamin D Take 1 tablet by mouth 2 (two) times daily. For osteoporosis   cholecalciferol 1000 units tablet Commonly known as:  VITAMIN D Take 1,000 Units by mouth daily. Take 1 tablet by mouth  daily for senile osteoporosis   clobetasol cream 0.05 % Commonly known as:  TEMOVATE Apply 1 application topically 2 (two) times daily. Reported on 11/18/2015   clopidogrel 75 MG tablet Commonly known as:  PLAVIX Take 75 mg by mouth daily. Take 1 tablet by mouth daily for CVA. Take with meals   escitalopram 10 MG tablet Commonly known as:  LEXAPRO Take 10 mg by mouth daily.   fluconazole 200 MG tablet Commonly known as:  DIFLUCAN 1 by mouth once weekly (Saturday) for vulvovaginitis   fluticasone 50 MCG/ACT nasal spray Commonly known as:  FLONASE Place 2 sprays into both nostrils daily.   gabapentin 400 MG capsule Commonly known as:  NEURONTIN Take 400 mg by mouth at bedtime.   gabapentin 100 MG capsule Commonly known as:  NEURONTIN Take 100 mg by mouth daily. In the morning   lactulose 10 GM/15ML solution Commonly known as:  CHRONULAC Take by mouth 2 (two) times daily. Give 30 cc   levothyroxine 25 MCG tablet Commonly known as:  SYNTHROID, LEVOTHROID Take 25 mcg by mouth daily.   loratadine 10 MG tablet Commonly known as:  CLARITIN Take 10 mg by mouth daily as needed for allergies.   MYRBETRIQ 25 MG Tb24 tablet Generic drug:  mirabegron ER Take 25 mg by mouth daily.   NAMENDA XR 28 MG Cp24 24 hr capsule Generic drug:  memantine Take 28 mg by  mouth daily.   nitroGLYCERIN 0.4 MG SL tablet Commonly known as:  NITROSTAT Place 0.4 mg under the tongue every 5 (five) minutes as needed for chest pain (repeat for 3 doses prn).   nystatin-triamcinolone cream Commonly known as:  MYCOLOG II Apply 1 application topically 2 (two) times daily. Apply topically around clitoral hood and labia minora daily   PHENobarbital 64.8 MG tablet Commonly known as:  LUMINAL Take one tablet by mouth twice daily for seizures   phenytoin 100 MG ER capsule Commonly known as:  DILANTIN Take 100 mg by mouth 3 (three) times daily.   polyethylene glycol packet Commonly known as:  MIRALAX  / GLYCOLAX Take 17 g by mouth daily as needed. Mix 17 gm with 4-8 oz of liquid daily for constipation.   ranitidine 75 MG tablet Commonly known as:  ZANTAC Take 75 mg by mouth daily as needed for heartburn.   sennosides-docusate sodium 8.6-50 MG tablet Commonly known as:  SENOKOT-S Take 1 tablet by mouth at bedtime.   SYSTANE BALANCE 0.6 % Soln Generic drug:  Propylene Glycol Apply 1 drop to eye 2 (two) times daily. Instill 1 drop each eye twice daily for dry eyes   traMADol 50 MG tablet Commonly known as:  ULTRAM Take 50 mg by mouth every 8 (eight) hours as needed for severe pain.   UNABLE TO FIND Med Name: Premarin Vaginal Cream- Apply cream topically via vagina twice a weekly on Sat & Wed for atrophic vaginitis   UNABLE TO FIND Med Name: Tucks Medica Pad Cooling. Apply 1 pad to anus up to 6 times a day for pain. May leave at bedside   vitamin B-12 500 MCG tablet Commonly known as:  CYANOCOBALAMIN Take 500 mcg by mouth daily.        PHYSICAL EXAM Vitals:   04/25/16 1505  BP: 126/70  Pulse: 69  Resp: 17  Temp: 97 F (36.1 C)  TempSrc: Oral  SpO2: 97%  Weight: 193 lb (87.5 kg)  Height: 5\' 3"  (1.6 m)    Body mass index is 34.19 kg/m.    General- elderly female, obese, in no acute distress Head- atraumatic, normocephalic Eyes- PERRLA, EOMI, no pallor, no icterus, no discharge Neck- no lymphadenopathy Mouth- normal mucus membrane, dentures present Cardiovascular- normal s1,s2, no murmurs Respiratory- bilateral clear to auscultation, no wheeze, no rhonchi, no crackles Abdomen- bowel sounds present, soft, non tender Musculoskeletal- able to move all 4 extremities, trace leg edema, chronic left sided weakness, uses a wheelchair and a walker Neurological- left sided weakness  Psychiatry- alert and oriented to person, place and time, normal mood and affect Skin- warm and dry   Labs CBC Latest Ref Rng & Units 02/27/2016 02/16/2016 12/14/2015  WBC 10:3/mL 4.9 4.2  5.7  Hemoglobin 12.0 - 16.0 g/dL 10.8(A) 11.1(A) 11.7(A)  Hematocrit 36 - 46 % 33(A) 35(A) 37  Platelets 150 - 399 K/L 222 225 199   CMP Latest Ref Rng & Units 03/14/2016 03/09/2016 02/27/2016  Glucose 70 - 99 mg/dL - - -  BUN 4 - 21 mg/dL - 9 12  Creatinine 0.5 - 1.1 mg/dL - 0.5 0.5  Sodium 782 - 147 mmol/L - 139 138  Potassium 3.4 - 5.3 mmol/L - 4.2 4.2  Chloride 96 - 112 mEq/L - - -  CO2 19 - 32 mEq/L - - -  Calcium 8.4 - 10.5 mg/dL - - -  Total Protein 6.0 - 8.3 g/dL - - -  Total Bilirubin 0.3 - 1.2 mg/dL - - -  Alkaline Phos 25 - 125 U/L 94 - -  AST 13 - 35 U/L 18 - -  ALT 7 - 35 U/L 18 - -   Lab Results  Component Value Date   TSH 2.78 03/09/2016   Lab Results  Component Value Date   HGBA1C 5.4 10/19/2015   Lipid Panel     Component Value Date/Time   CHOL 186 03/09/2016   TRIG 76 03/09/2016   HDL 62 03/09/2016   CHOLHDL 2.7 07/25/2010 0036   VLDL 21 07/25/2010 0036   LDLCALC 109 03/09/2016    normal phenytoin and phenobarbital level on 10/19/15   Assessment/plan  Anemia of ckd Chronic and stable. Continue b12 supplement  ckd stage 2 Monitor renal function q 4 month  b12 def anemia Continue b12 500 mcg daily and monitor  Oneal GroutMAHIMA Matther Labell, MD Internal Medicine [facility] [address] [place], [place] [address] Cell Phone (Monday-Friday 8 am - 5 pm): [phone] On Call: [phone] and follow prompts after 5 pm and on weekends Office Phone: [phone] Office Fax: [phone]

## 2016-05-31 ENCOUNTER — Encounter: Payer: Self-pay | Admitting: Internal Medicine

## 2016-05-31 ENCOUNTER — Non-Acute Institutional Stay (SKILLED_NURSING_FACILITY): Payer: Medicare Other | Admitting: Internal Medicine

## 2016-05-31 DIAGNOSIS — Z8673 Personal history of transient ischemic attack (TIA), and cerebral infarction without residual deficits: Secondary | ICD-10-CM | POA: Diagnosis not present

## 2016-05-31 DIAGNOSIS — I69952 Hemiplegia and hemiparesis following unspecified cerebrovascular disease affecting left dominant side: Secondary | ICD-10-CM | POA: Diagnosis not present

## 2016-05-31 DIAGNOSIS — F330 Major depressive disorder, recurrent, mild: Secondary | ICD-10-CM

## 2016-05-31 NOTE — Progress Notes (Signed)
Patient ID: Lauren Burch, female   DOB: 1936-06-02, 80 y.o.   MRN: [id number]     LOCATION: [facility]  [facility], [place]  Allergies  Allergen Reactions  . Lasix [Furosemide]   . Latex     CODE STATUS: DNR  GOALS OF CARE: Advanced Directives 03/20/2016  Does patient have an advance directive? Yes  Type of Advance Directive Out of facility DNR (pink MOST or yellow form)  Does patient want to make changes to advanced directive? No - Patient declined  Copy of advanced directive(s) in chart? Yes     Chief Complaint  Patient presents with  . Medical Management of Chronic Issues    Routine Visit    HPI 80 y.o. female seen today for routine visit. She has been at her baseline. No new concern from patient or nursing staff this visit.   REVIEW OF SYSTEM Constitutional: Negative for fever Respiratory: Negative for cough, shortness of breath Cardiovascular: Negative for chest pain, palpitations Gastrointestinal: Negative for heartburn, nausea, vomiting, abdominal pain.  Genitourinary: Negative for dysuria  Musculoskeletal: Negative for back pain. Using a walker and wheelchair Skin: Negative for itching and rash.  Neurological: Negative for dizziness   Past Medical History:  Diagnosis Date  . CVA (cerebral infarction)   . Seizures (HCC)   . TBI (traumatic brain injury) [facility])    Age 80 - Seizures       Medication List       Accurate as of 05/31/16  2:37 PM. Always use your most recent med list.          acetaminophen 325 MG tablet Commonly known as:  TYLENOL Take 650 mg by mouth every 4 (four) hours as needed for mild pain.   CALTRATE 600+D 600-400 MG-UNIT tablet Generic drug:  Calcium Carbonate-Vitamin D Take 1 tablet by mouth 2 (two) times daily. For osteoporosis   cholecalciferol 1000 units tablet Commonly known as:  VITAMIN D Take 1,000 Units by mouth daily. Take 1 tablet by mouth daily for senile osteoporosis     clobetasol cream 0.05 % Commonly known as:  TEMOVATE Apply 1 application topically 2 (two) times daily. Reported on 11/18/2015   clopidogrel 75 MG tablet Commonly known as:  PLAVIX Take 75 mg by mouth daily. Take 1 tablet by mouth daily for CVA. Take with meals   escitalopram 10 MG tablet Commonly known as:  LEXAPRO Take 10 mg by mouth daily.   fluconazole 200 MG tablet Commonly known as:  DIFLUCAN 1 by mouth once weekly (Saturday) for vulvovaginitis   fluticasone 50 MCG/ACT nasal spray Commonly known as:  FLONASE Place 2 sprays into both nostrils daily.   gabapentin 400 MG capsule Commonly known as:  NEURONTIN Take 400 mg by mouth at bedtime.   gabapentin 100 MG capsule Commonly known as:  NEURONTIN Take 100 mg by mouth daily. In the morning   lactulose 10 GM/15ML solution Commonly known as:  CHRONULAC Take by mouth 2 (two) times daily. Give 30 cc   levothyroxine 25 MCG tablet Commonly known as:  SYNTHROID, LEVOTHROID Take 25 mcg by mouth daily.   loratadine 10 MG tablet Commonly known as:  CLARITIN Take 10 mg by mouth daily as needed for allergies.   MYRBETRIQ 25 MG Tb24 tablet Generic drug:  mirabegron ER Take 25 mg by mouth daily.   NAMENDA XR 28 MG Cp24 24 hr capsule Generic drug:  memantine Take 28 mg by mouth daily.   nitroGLYCERIN 0.4 MG  SL tablet Commonly known as:  NITROSTAT Place 0.4 mg under the tongue every 5 (five) minutes as needed for chest pain (repeat for 3 doses prn).   nystatin-triamcinolone cream Commonly known as:  MYCOLOG II Apply 1 application topically 2 (two) times daily. Apply topically around clitoral hood and labia minora daily   PHENobarbital 64.8 MG tablet Commonly known as:  LUMINAL Take one tablet by mouth twice daily for seizures   phenytoin 100 MG ER capsule Commonly known as:  DILANTIN Take 100 mg by mouth 3 (three) times daily.   polyethylene glycol packet Commonly known as:  MIRALAX / GLYCOLAX Take 17 g by mouth  daily as needed. Mix 17 gm with 4-8 oz of liquid daily for constipation.   ranitidine 75 MG tablet Commonly known as:  ZANTAC Take 75 mg by mouth daily as needed for heartburn.   sennosides-docusate sodium 8.6-50 MG tablet Commonly known as:  SENOKOT-S Take 1 tablet by mouth at bedtime.   SYSTANE BALANCE 0.6 % Soln Generic drug:  Propylene Glycol Apply 1 drop to eye 2 (two) times daily. Instill 1 drop each eye twice daily for dry eyes   traMADol 50 MG tablet Commonly known as:  ULTRAM Take 50 mg by mouth every 8 (eight) hours as needed for severe pain.   UNABLE TO FIND Med Name: Premarin Vaginal Cream- Apply cream topically via vagina twice a weekly on Sat & Wed for atrophic vaginitis   UNABLE TO FIND Med Name: Tucks Medica Pad Cooling. Apply 1 pad to anus up to 6 times a day for pain. May leave at bedside   vitamin B-12 500 MCG tablet Commonly known as:  CYANOCOBALAMIN Take 500 mcg by mouth daily.        PHYSICAL EXAM Vitals:   05/31/16 1431  BP: 134/80  Pulse: 84  Resp: 18  Temp: 97.3 F (36.3 C)  TempSrc: Oral  SpO2: 90%  Weight: 193 lb (87.5 kg)  Height: 5\' 3"  (1.6 m)    Body mass index is 34.19 kg/m.    General- elderly female, obese, in no acute distress Head- atraumatic, normocephalic Eyes- PERRLA, EOMI, no pallor, no icterus, no discharge Neck- no lymphadenopathy Mouth- normal mucus membrane, dentures present Cardiovascular- normal s1,s2, no murmurs Respiratory- bilateral clear to auscultation, no wheeze, no rhonchi, no crackles Abdomen- bowel sounds present, soft, non tender Musculoskeletal- able to move all 4 extremities, trace leg edema, chronic left sided weakness, uses a wheelchair and a walker Neurological- left sided weakness present, aao x 3 Psychiatry- normal mood and affect Skin- warm and dry   Labs CBC Latest Ref Rng & Units 02/27/2016 02/16/2016 12/14/2015  WBC 10:3/mL 4.9 4.2 5.7  Hemoglobin 12.0 - 16.0 g/dL 10.8(A) 11.1(A) 11.7(A)    Hematocrit 36 - 46 % 33(A) 35(A) 37  Platelets 150 - 399 K/L 222 225 199   CMP Latest Ref Rng & Units 03/14/2016 03/09/2016 02/27/2016  Glucose 70 - 99 mg/dL - - -  BUN 4 - 21 mg/dL - 9 12  Creatinine 0.5 - 1.1 mg/dL - 0.5 0.5  Sodium 161 - 147 mmol/L - 139 138  Potassium 3.4 - 5.3 mmol/L - 4.2 4.2  Chloride 96 - 112 mEq/L - - -  CO2 19 - 32 mEq/L - - -  Calcium 8.4 - 10.5 mg/dL - - -  Total Protein 6.0 - 8.3 g/dL - - -  Total Bilirubin 0.3 - 1.2 mg/dL - - -  Alkaline Phos 25 - 125 U/L 94 - -  AST 13 - 35 U/L 18 - -  ALT 7 - 35 U/L 18 - -   Lab Results  Component Value Date   TSH 2.78 03/09/2016   Lab Results  Component Value Date   HGBA1C 5.4 10/19/2015   Lipid Panel     Component Value Date/Time   CHOL 186 03/09/2016   TRIG 76 03/09/2016   HDL 62 03/09/2016   CHOLHDL 2.7 07/25/2010 0036   VLDL 21 07/25/2010 0036   LDLCALC 109 03/09/2016    normal phenytoin and phenobarbital level on 10/19/15   Assessment/plan  History of CVA With residual left side weakness. Continue her plavix  Major depression Followed by psych, no new changes. Continue ehr lexapro  Hemiplegia and hemiparesis post CVA. Currently on plavix. Fall precautions to be taken     Oneal GroutMAHIMA Shunda Rabadi, MD Internal Medicine [facility] [address] [place], [place] [address] Cell Phone (Monday-Friday 8 am - 5 pm): [phone] On Call: [phone] and follow prompts after 5 pm and on weekends Office Phone: [phone] Office Fax: [phone]

## 2016-07-05 LAB — CBC AND DIFFERENTIAL
HEMATOCRIT: 34 % — AB (ref 36–46)
HEMOGLOBIN: 11.1 g/dL — AB (ref 12.0–16.0)
Platelets: 178 10*3/uL (ref 150–399)
WBC: 4.2 10*3/mL

## 2016-07-05 LAB — BASIC METABOLIC PANEL
BUN: 13 mg/dL (ref 4–21)
Creatinine: 0.4 mg/dL — AB (ref 0.5–1.1)
GLUCOSE: 97 mg/dL
POTASSIUM: 4.4 mmol/L (ref 3.4–5.3)
SODIUM: 140 mmol/L (ref 137–147)

## 2016-07-05 LAB — LIPID PANEL
CHOLESTEROL: 180 mg/dL (ref 0–200)
HDL: 62 mg/dL (ref 35–70)
LDL CALC: 95 mg/dL
Triglycerides: 115 mg/dL (ref 40–160)

## 2016-07-05 LAB — HEPATIC FUNCTION PANEL
ALT: 19 U/L (ref 7–35)
AST: 15 U/L (ref 13–35)
Alkaline Phosphatase: 81 U/L (ref 25–125)
BILIRUBIN, TOTAL: 0.3 mg/dL

## 2016-07-27 ENCOUNTER — Non-Acute Institutional Stay (SKILLED_NURSING_FACILITY): Payer: Medicare Other | Admitting: Internal Medicine

## 2016-07-27 ENCOUNTER — Encounter: Payer: Self-pay | Admitting: Internal Medicine

## 2016-07-27 DIAGNOSIS — K5909 Other constipation: Secondary | ICD-10-CM | POA: Diagnosis not present

## 2016-07-27 DIAGNOSIS — G40909 Epilepsy, unspecified, not intractable, without status epilepticus: Secondary | ICD-10-CM | POA: Diagnosis not present

## 2016-07-27 NOTE — Progress Notes (Signed)
Patient ID: Lauren Burch, female   DOB: [birth year], 80 y.o.   MRN: [id number]     LOCATION: [facility]  [facility]  Allergies  Allergen Reactions  . Lasix [Furosemide]   . Latex     CODE STATUS: DNR  GOALS OF CARE: Advanced Directives 03/20/2016  Does patient have an advance directive? Yes  Type of Advance Directive Out of facility DNR (pink MOST or yellow form)  Does patient want to make changes to advanced directive? No - Patient declined  Copy of advanced directive(s) in chart? Yes     Chief Complaint  Patient presents with  . Medical Management of Chronic Issues    Routine Visit    HPI 80 y.o. female seen today for routine visit. No new concern from patient or nursing staff.   REVIEW OF SYSTEM Constitutional: Negative for fever Respiratory: Negative for shortness of breath Cardiovascular: Negative for chest pain, palpitations Gastrointestinal: Negative for heartburn, nausea, vomiting, abdominal pain.  Genitourinary: Negative for dysuria  Musculoskeletal: Negative for back pain. Using a walker and wheelchair Skin: Negative for itching and rash.  Neurological: Negative for dizziness   Past Medical History:  Diagnosis Date  . CVA (cerebral infarction)   . Seizures (HCC)   . TBI (traumatic brain injury) [facility](HCC)    Age 89 - Seizures       Medication List       Accurate as of 07/27/16  3:20 PM. Always use your most recent med list.          acetaminophen 325 MG tablet Commonly known as:  TYLENOL Take 650 mg by mouth every 4 (four) hours as needed for mild pain.   CALTRATE 600+D 600-400 MG-UNIT tablet Generic drug:  Calcium Carbonate-Vitamin D Take 1 tablet by mouth 2 (two) times daily. For osteoporosis   cholecalciferol 1000 units tablet Commonly known as:  VITAMIN D Take 1,000 Units by mouth daily. Take 1 tablet by mouth daily for senile osteoporosis   clobetasol cream 0.05 % Commonly known as:   TEMOVATE Apply 1 application topically 2 (two) times daily. Reported on 11/18/2015   clopidogrel 75 MG tablet Commonly known as:  PLAVIX Take 75 mg by mouth daily. Take 1 tablet by mouth daily for CVA. Take with meals   escitalopram 10 MG tablet Commonly known as:  LEXAPRO Take 10 mg by mouth daily.   fluconazole 200 MG tablet Commonly known as:  DIFLUCAN 1 by mouth once weekly (Saturday) for vulvovaginitis   fluticasone 50 MCG/ACT nasal spray Commonly known as:  FLONASE Place 2 sprays into both nostrils daily.   gabapentin 400 MG capsule Commonly known as:  NEURONTIN Take 400 mg by mouth at bedtime.   gabapentin 100 MG capsule Commonly known as:  NEURONTIN Take 100 mg by mouth daily. In the morning   lactulose 10 GM/15ML solution Commonly known as:  CHRONULAC Take by mouth 2 (two) times daily. Give 30 cc   levothyroxine 25 MCG tablet Commonly known as:  SYNTHROID, LEVOTHROID Take 25 mcg by mouth daily.   loratadine 10 MG tablet Commonly known as:  CLARITIN Take 10 mg by mouth daily as needed for allergies.   MYRBETRIQ 25 MG Tb24 tablet Generic drug:  mirabegron ER Take 25 mg by mouth daily.   NAMENDA XR 28 MG Cp24 24 hr capsule Generic drug:  memantine Take 28 mg by mouth daily.   nitroGLYCERIN 0.4 MG SL tablet Commonly known as:  NITROSTAT Place 0.4 mg  under the tongue every 5 (five) minutes as needed for chest pain (repeat for 3 doses prn).   nystatin-triamcinolone cream Commonly known as:  MYCOLOG II Apply 1 application topically 2 (two) times daily. Apply topically around clitoral hood and labia minora daily   PHENobarbital 64.8 MG tablet Commonly known as:  LUMINAL Take one tablet by mouth twice daily for seizures   phenytoin 100 MG ER capsule Commonly known as:  DILANTIN Take 200 mg by mouth 2 (two) times daily.   polyethylene glycol packet Commonly known as:  MIRALAX / GLYCOLAX Take 17 g by mouth daily as needed. Mix 17 gm with 4-8 oz of liquid  daily for constipation.   ranitidine 75 MG tablet Commonly known as:  ZANTAC Take 75 mg by mouth daily as needed for heartburn.   sennosides-docusate sodium 8.6-50 MG tablet Commonly known as:  SENOKOT-S Take 1 tablet by mouth at bedtime.   SYSTANE BALANCE 0.6 % Soln Generic drug:  Propylene Glycol Apply 1 drop to eye 2 (two) times daily. Instill 1 drop each eye twice daily for dry eyes   traMADol 50 MG tablet Commonly known as:  ULTRAM Take 50 mg by mouth every 8 (eight) hours as needed for severe pain.   UNABLE TO FIND Med Name: Premarin Vaginal Cream- Apply cream topically via vagina twice a weekly on Sat & Wed for atrophic vaginitis   UNABLE TO FIND Med Name: Tucks Medica Pad Cooling. Apply 1 pad to anus up to 6 times a day for pain. May leave at bedside   vitamin B-12 500 MCG tablet Commonly known as:  CYANOCOBALAMIN Take 500 mcg by mouth daily.        PHYSICAL EXAM Vitals:   07/27/16 1509  BP: (!) 141/76  Pulse: 78  Resp: 18  Temp: 97.2 F (36.2 C)  TempSrc: Oral  SpO2: 96%  Weight: 198 lb 9.6 oz (90.1 kg)  Height: 5\' 3"  (1.6 m)    Body mass index is 35.18 kg/m.    General- elderly female, obese, in no acute distress Head- atraumatic, normocephalic Eyes- PERRLA, EOMI, no pallor, no icterus, no discharge Neck- no lymphadenopathy Mouth- normal mucus membrane, dentures present Cardiovascular- normal s1,s2, no murmur Respiratory- bilateral clear to auscultation, no wheeze, no rhonchi, no crackles Abdomen- bowel sounds present, soft, non tender Musculoskeletal- able to move all 4 extremities, trace leg edema, chronic left sided weakness, uses a wheelchair and a walker Neurological- left sided weakness present, aao x 3 Skin- warm and dry   Labs CBC Latest Ref Rng & Units 07/05/2016 02/27/2016 02/16/2016  WBC 10:3/mL 4.2 4.9 4.2  Hemoglobin 12.0 - 16.0 g/dL 11.1(A) 10.8(A) 11.1(A)  Hematocrit 36 - 46 % 34(A) 33(A) 35(A)  Platelets 150 - 399 K/L 178 222  225   CMP Latest Ref Rng & Units 07/05/2016 03/14/2016 03/09/2016  Glucose 70 - 99 mg/dL - - -  BUN 4 - 21 mg/dL 13 - 9  Creatinine 0.5 - 1.1 mg/dL 7.8(G0.4(A) - 0.5  Sodium 956137 - 147 mmol/L 140 - 139  Potassium 3.4 - 5.3 mmol/L 4.4 - 4.2  Chloride 96 - 112 mEq/L - - -  CO2 19 - 32 mEq/L - - -  Calcium 8.4 - 10.5 mg/dL - - -  Total Protein 6.0 - 8.3 g/dL - - -  Total Bilirubin 0.3 - 1.2 mg/dL - - -  Alkaline Phos 25 - 125 U/L 81 94 -  AST 13 - 35 U/L 15 18 -  ALT  7 - 35 U/L 19 18 -   Lab Results  Component Value Date   TSH 2.78 03/09/2016   Lab Results  Component Value Date   HGBA1C 5.4 10/19/2015   Lipid Panel     Component Value Date/Time   CHOL 180 07/05/2016   TRIG 115 07/05/2016   HDL 62 07/05/2016   CHOLHDL 2.7 07/25/2010 0036   VLDL 21 07/25/2010 0036   LDLCALC 95 07/05/2016    normal phenytoin and phenobarbital level on 10/19/15   Assessment/plan  Seizure disorder Remains seizure-free. Continue phenobarbital current regimen. Her Dilantin dosing was recently adjusted to 200 mg twice a day. Has a lot of level check in 4 weeks.  Chronic constipation Continue lactulose twice a day and Senokot-S at bedtime. Hydration to be maintained. Also on MiraLAX daily as needed.    Oneal Grout, MD Internal Medicine [facility] [address] [place], [place] [address] Cell Phone (Monday-Friday 8 am - 5 pm): [phone] On Call: [phone] and follow prompts after 5 pm and on weekends Office Phone: [phone] Office Fax: [phone]

## 2016-08-31 ENCOUNTER — Encounter: Payer: Self-pay | Admitting: Internal Medicine

## 2016-08-31 ENCOUNTER — Non-Acute Institutional Stay (SKILLED_NURSING_FACILITY): Payer: Medicare Other | Admitting: Internal Medicine

## 2016-08-31 DIAGNOSIS — K219 Gastro-esophageal reflux disease without esophagitis: Secondary | ICD-10-CM

## 2016-08-31 DIAGNOSIS — E039 Hypothyroidism, unspecified: Secondary | ICD-10-CM

## 2016-08-31 DIAGNOSIS — J309 Allergic rhinitis, unspecified: Secondary | ICD-10-CM | POA: Diagnosis not present

## 2016-08-31 NOTE — Progress Notes (Signed)
Patient ID: Lauren Burch, female   DOB: [birth year], 80 y.o.   MRN: [id number]     LOCATION: [facility]  [facility]  Allergies  Allergen Reactions  . Lasix [Furosemide]   . Latex     CODE STATUS: DNR  GOALS OF CARE: Advanced Directives 03/20/2016  Does Patient Have a Medical Advance Directive? Yes  Type of Advance Directive Out of facility DNR (pink MOST or yellow form)  Does patient want to make changes to medical advance directive? No - Patient declined  Copy of Healthcare Power of Attorney in Chart? Yes     Chief Complaint  Patient presents with  . Medical Management of Chronic Issues    Routine Visit     HPI 80 y.o. female seen today for routine visit. She has been at her baseline today. No new concern from patient or nursing staff this visit. Patient being followed by psych services for her mood. She is out of bed daily. She participates in facility activities.  REVIEW OF SYSTEM Constitutional: Negative for fever Respiratory: Negative for cough, shortness of breath Cardiovascular: Negative for chest pain, palpitations Gastrointestinal: Negative for heartburn, nausea, vomiting, abdominal pain.  Genitourinary: Negative for dysuria  Musculoskeletal: Negative for back pain. Using a walker and wheelchair. No fall.  Skin: Negative for itching and rash.  Neurological: Negative for dizziness   Past Medical History:  Diagnosis Date  . CVA (cerebral infarction)   . Seizures (HCC)   . TBI (traumatic brain injury) [facility](HCC)    Age 80 - Seizures     Allergies as of 08/31/2016      Reactions   Lasix [furosemide]    Latex       Medication List       Accurate as of 08/31/16  4:10 PM. Always use your most recent med list.          acetaminophen 325 MG tablet Commonly known as:  TYLENOL Take 650 mg by mouth every 4 (four) hours as needed for mild pain.   CALTRATE 600+D 600-400 MG-UNIT tablet Generic drug:  Calcium  Carbonate-Vitamin D Take 1 tablet by mouth 2 (two) times daily. For osteoporosis   cholecalciferol 1000 units tablet Commonly known as:  VITAMIN D Take 1,000 Units by mouth daily. Take 1 tablet by mouth daily for senile osteoporosis   clobetasol cream 0.05 % Commonly known as:  TEMOVATE Apply 1 application topically 2 (two) times daily. Reported on 11/18/2015   clopidogrel 75 MG tablet Commonly known as:  PLAVIX Take 75 mg by mouth daily. Take 1 tablet by mouth daily for CVA. Take with meals   divalproex 125 MG capsule Commonly known as:  DEPAKOTE SPRINKLE Take 125 mg by mouth 2 (two) times daily.   escitalopram 10 MG tablet Commonly known as:  LEXAPRO Take 10 mg by mouth daily.   fluconazole 200 MG tablet Commonly known as:  DIFLUCAN 1 by mouth once weekly (Saturday) for vulvovaginitis   fluticasone 50 MCG/ACT nasal spray Commonly known as:  FLONASE Place 2 sprays into both nostrils daily.   gabapentin 400 MG capsule Commonly known as:  NEURONTIN Take 400 mg by mouth at bedtime.   gabapentin 100 MG capsule Commonly known as:  NEURONTIN Take 100 mg by mouth daily. In the morning   lactulose 10 GM/15ML solution Commonly known as:  CHRONULAC Take by mouth 2 (two) times daily. Give 30 cc   levothyroxine 25 MCG tablet Commonly known as:  SYNTHROID, LEVOTHROID  Take 25 mcg by mouth daily.   loratadine 10 MG tablet Commonly known as:  CLARITIN Take 10 mg by mouth daily as needed for allergies.   MYRBETRIQ 25 MG Tb24 tablet Generic drug:  mirabegron ER Take 25 mg by mouth daily.   NAMENDA XR 28 MG Cp24 24 hr capsule Generic drug:  memantine Take 28 mg by mouth daily.   nitroGLYCERIN 0.4 MG SL tablet Commonly known as:  NITROSTAT Place 0.4 mg under the tongue every 5 (five) minutes as needed for chest pain (repeat for 3 doses prn).   nystatin-triamcinolone cream Commonly known as:  MYCOLOG II Apply 1 application topically daily. Apply topically around clitoral  hood and labia minora daily   PHENobarbital 64.8 MG tablet Commonly known as:  LUMINAL Take one tablet by mouth twice daily for seizures   phenytoin 100 MG ER capsule Commonly known as:  DILANTIN Take 100 mg by mouth 3 (three) times daily.   polyethylene glycol packet Commonly known as:  MIRALAX / GLYCOLAX Take 17 g by mouth daily as needed. Mix 17 gm with 4-8 oz of liquid daily for constipation.   ranitidine 75 MG tablet Commonly known as:  ZANTAC Take 75 mg by mouth daily as needed for heartburn.   sennosides-docusate sodium 8.6-50 MG tablet Commonly known as:  SENOKOT-S Take 1 tablet by mouth at bedtime.   SYSTANE BALANCE 0.6 % Soln Generic drug:  Propylene Glycol Apply 1 drop to eye 2 (two) times daily. Instill 1 drop each eye twice daily for dry eyes   traMADol 50 MG tablet Commonly known as:  ULTRAM Take 50 mg by mouth every 8 (eight) hours as needed for severe pain.   UNABLE TO FIND Med Name: Tucks Medica Pad Cooling. Apply 1 pad to anus up to 6 times a day for pain. May leave at bedside   vitamin B-12 500 MCG tablet Commonly known as:  CYANOCOBALAMIN Take 500 mcg by mouth daily.        PHYSICAL EXAM Vitals:   08/31/16 1604  BP: 115/61  Pulse: 79  Resp: 18  Temp: 98.1 F (36.7 C)  TempSrc: Oral  SpO2: 96%  Weight: 196 lb 9.6 oz (89.2 kg)  Height: 5\' 3"  (1.6 m)    Body mass index is 34.83 kg/m.    General- elderly female, obese, in no acute distress Head- atraumatic, normocephalic Eyes- PERRLA, EOMI, no pallor, no icterus, no discharge Neck- no lymphadenopathy Mouth- normal mucus membrane, dentures present Cardiovascular- normal s1,s2, no murmurs Respiratory- bilateral clear to auscultation, no wheeze, no rhonchi, no crackles Abdomen- bowel sounds present, soft, non tender Musculoskeletal- able to move all 4 extremities, trace leg edema, chronic left sided weakness, uses a wheelchair and a walker Neurological- left sided weakness present, aao  x 3 Psychiatry- normal mood and affect Skin- warm and dry   Labs CBC Latest Ref Rng & Units 07/05/2016 02/27/2016 02/16/2016  WBC 10:3/mL 4.2 4.9 4.2  Hemoglobin 12.0 - 16.0 g/dL 11.1(A) 10.8(A) 11.1(A)  Hematocrit 36 - 46 % 34(A) 33(A) 35(A)  Platelets 150 - 399 K/L 178 222 225   CMP Latest Ref Rng & Units 07/05/2016 03/14/2016 03/09/2016  Glucose 70 - 99 mg/dL - - -  BUN 4 - 21 mg/dL 13 - 9  Creatinine 0.5 - 1.1 mg/dL 4.0(J) - 0.5  Sodium 811 - 147 mmol/L 140 - 139  Potassium 3.4 - 5.3 mmol/L 4.4 - 4.2  Chloride 96 - 112 mEq/L - - -  CO2 19 -  32 mEq/L - - -  Calcium 8.4 - 10.5 mg/dL - - -  Total Protein 6.0 - 8.3 g/dL - - -  Total Bilirubin 0.3 - 1.2 mg/dL - - -  Alkaline Phos 25 - 125 U/L 81 94 -  AST 13 - 35 U/L 15 18 -  ALT 7 - 35 U/L 19 18 -   Lab Results  Component Value Date   TSH 2.78 03/09/2016   Lab Results  Component Value Date   HGBA1C 5.4 10/19/2015   Lipid Panel     Component Value Date/Time   CHOL 180 07/05/2016   TRIG 115 07/05/2016   HDL 62 07/05/2016   CHOLHDL 2.7 07/25/2010 0036   VLDL 21 07/25/2010 0036   LDLCALC 95 07/05/2016     Assessment/plan  Allergies rhinitis Currently on Claritin 10 mg daily as needed. Monitor the symptom.  Gastroesophageal reflux disease Symptoms are controlled present. Continue ranitidine 75 mg daily as needed.  Hypothyroidism Lab Results  Component Value Date   TSH 2.78 03/09/2016   Review TSH level. Continue levothyroxine 25 g daily for now.    Oneal GroutMAHIMA Daila Elbert, MD Internal Medicine [facility] [address] [place], [place] [address] Cell Phone (Monday-Friday 8 am - 5 pm): [phone] On Call: [phone] and follow prompts after 5 pm and on weekends Office Phone: [phone] Office Fax: [phone]

## 2016-09-28 LAB — CBC AND DIFFERENTIAL
HEMATOCRIT: 34 % — AB (ref 36–46)
HEMOGLOBIN: 11.2 g/dL — AB (ref 12.0–16.0)
Platelets: 169 10*3/uL (ref 150–399)
WBC: 3.7 10^3/mL

## 2016-09-28 LAB — BASIC METABOLIC PANEL
BUN: 20 mg/dL (ref 4–21)
Creatinine: 0.4 mg/dL — AB (ref 0.5–1.1)
Glucose: 96 mg/dL
POTASSIUM: 3.9 mmol/L (ref 3.4–5.3)
SODIUM: 139 mmol/L (ref 137–147)

## 2016-10-25 LAB — HEMOGLOBIN A1C: HEMOGLOBIN A1C: 5.5

## 2016-12-05 ENCOUNTER — Non-Acute Institutional Stay (SKILLED_NURSING_FACILITY): Payer: Medicare Other | Admitting: Internal Medicine

## 2016-12-05 DIAGNOSIS — J309 Allergic rhinitis, unspecified: Secondary | ICD-10-CM | POA: Diagnosis not present

## 2016-12-05 DIAGNOSIS — F39 Unspecified mood [affective] disorder: Secondary | ICD-10-CM | POA: Diagnosis not present

## 2016-12-05 DIAGNOSIS — G40909 Epilepsy, unspecified, not intractable, without status epilepticus: Secondary | ICD-10-CM

## 2016-12-05 DIAGNOSIS — E039 Hypothyroidism, unspecified: Secondary | ICD-10-CM | POA: Diagnosis not present

## 2016-12-05 NOTE — Progress Notes (Signed)
Patient ID: Lauren Burch, female   DOB: June 20, 1936, 81 y.o.   MRN: [id number]     LOCATION: [facility]  [facility], [facility], [place]  Allergies  Allergen Reactions  . Lasix [Furosemide]   . Latex     CODE STATUS: DNR  GOALS OF CARE: Advanced Directives 03/20/2016  Does Patient Have a Medical Advance Directive? Yes  Type of Advance Directive Out of facility DNR (pink MOST or yellow form)  Does patient want to make changes to medical advance directive? No - Patient declined  Copy of Healthcare Power of Attorney in Chart? Yes     Chief Complaint  Patient presents with  . Medical Management of Chronic Issues    Routine Visit     HPI 81 y.o. female seen today for routine visit. She has been at her baseline today. No new concern from patient this visit. Per nursing she has been having paranoia and required depakote and is being followed by psych services. or nursing staff this visit. Patient being followed by psych services for her mood. She is out of bed daily. She participates in facility activities.  REVIEW OF SYSTEM Constitutional: Negative for fever Respiratory: Negative for cough, shortness of breath Cardiovascular: Negative for chest pain, palpitations Gastrointestinal: Negative for nausea, vomiting, abdominal pain.  Genitourinary: Negative for dysuria  Musculoskeletal: Negative for back pain. Using a walker and wheelchair. No fall.  Skin: Negative for itching and rash.  Neurological: Negative for dizziness Psychiatry: negative for hallucination this visit.    Past Medical History:  Diagnosis Date  . CVA (cerebral infarction)   . Seizures (HCC)   . TBI (traumatic brain injury) [facility])    Age 35 - Seizures     Allergies as of 12/05/2016      Reactions   Lasix [furosemide]    Latex       Medication List       Accurate as of 12/05/16  2:25 PM. Always use your most recent med list.          acetaminophen 325 MG tablet Commonly known  as:  TYLENOL Take 650 mg by mouth every 4 (four) hours as needed for mild pain.   BIOFREEZE 4 % Gel Generic drug:  Menthol (Topical Analgesic) Apply 1 application topically 2 (two) times daily. 8 am and 2 pm   CALTRATE 600+D 600-400 MG-UNIT tablet Generic drug:  Calcium Carbonate-Vitamin D Take 1 tablet by mouth 2 (two) times daily. For osteoporosis   cholecalciferol 1000 units tablet Commonly known as:  VITAMIN D Take 1,000 Units by mouth daily. Take 1 tablet by mouth daily for senile osteoporosis   clopidogrel 75 MG tablet Commonly known as:  PLAVIX Take 75 mg by mouth daily. Take 1 tablet by mouth daily for CVA. Take with meals   divalproex 125 MG capsule Commonly known as:  DEPAKOTE SPRINKLE Take 125 mg by mouth at bedtime.   escitalopram 10 MG tablet Commonly known as:  LEXAPRO Take 10 mg by mouth daily.   fluconazole 200 MG tablet Commonly known as:  DIFLUCAN 1 by mouth once weekly (Saturday) for vulvovaginitis   fluticasone 50 MCG/ACT nasal spray Commonly known as:  FLONASE Place 2 sprays into both nostrils daily.   gabapentin 100 MG capsule Commonly known as:  NEURONTIN Take 100 mg by mouth daily. In the morning   gabapentin 100 MG capsule Commonly known as:  NEURONTIN Take 200 mg by mouth at bedtime.   guaiFENesin 100 MG/5ML Soln Commonly  known as:  ROBITUSSIN Take 10 mLs by mouth every 4 (four) hours as needed for cough.   lactulose 10 GM/15ML solution Commonly known as:  CHRONULAC Take by mouth 2 (two) times daily. Give 30 cc   levothyroxine 25 MCG tablet Commonly known as:  SYNTHROID, LEVOTHROID Take 25 mcg by mouth daily.   loperamide 2 MG tablet Commonly known as:  IMODIUM A-D Take 2 mg by mouth 4 (four) times daily as needed for diarrhea or loose stools.   loratadine 10 MG tablet Commonly known as:  CLARITIN Take 10 mg by mouth daily as needed for allergies.   MYRBETRIQ 25 MG Tb24 tablet Generic drug:  mirabegron ER Take 25 mg by mouth  daily.   NAMENDA XR 28 MG Cp24 24 hr capsule Generic drug:  memantine Take 28 mg by mouth daily.   nitroGLYCERIN 0.4 MG SL tablet Commonly known as:  NITROSTAT Place 0.4 mg under the tongue every 5 (five) minutes as needed for chest pain (repeat for 3 doses prn).   PHENobarbital 64.8 MG tablet Commonly known as:  LUMINAL Take one tablet by mouth twice daily for seizures   phenytoin 100 MG ER capsule Commonly known as:  DILANTIN Take 100 mg by mouth 3 (three) times daily.   polyethylene glycol packet Commonly known as:  MIRALAX / GLYCOLAX Take 17 g by mouth daily as needed. Mix 17 gm with 4-8 oz of liquid daily for constipation.   ranitidine 75 MG tablet Commonly known as:  ZANTAC Take 75 mg by mouth daily as needed for heartburn.   sennosides-docusate sodium 8.6-50 MG tablet Commonly known as:  SENOKOT-S Take 1 tablet by mouth at bedtime.   SYSTANE BALANCE 0.6 % Soln Generic drug:  Propylene Glycol Apply 1 drop to eye 2 (two) times daily. Instill 1 drop each eye twice daily for dry eyes   UNABLE TO FIND Med Name: Tucks Medica Pad Cooling. Apply 1 pad to anus up to 6 times a day for pain. May leave at bedside   vitamin B-12 500 MCG tablet Commonly known as:  CYANOCOBALAMIN Take 500 mcg by mouth daily.        PHYSICAL EXAM Vitals:   12/05/16 1413  BP: 138/82  Pulse: 86  Resp: 18  Temp: 98 F (36.7 C)  TempSrc: Oral  SpO2: 96%  Weight: 189 lb 12.8 oz (86.1 kg)  Height: 5\' 3"  (1.6 m)    Body mass index is 33.62 kg/m.    General- elderly female, obese, in no acute distress Head- atraumatic, normocephalic Eyes- PERRLA, EOMI, no pallor, no icterus, no discharge Neck- no lymphadenopathy Mouth- normal mucus membrane, dentures present Cardiovascular- normal s1,s2, no murmurs Respiratory- bilateral clear to auscultation, no wheeze, no rhonchi, no crackles Abdomen- bowel sounds present, soft, non tender Musculoskeletal- able to move all 4 extremities, trace  leg edema, chronic left sided weakness, uses a wheelchair Neurological- left sided weakness present, aao x 3 Psychiatry- normal mood and affect this visit Skin- warm and dry   Labs CBC Latest Ref Rng & Units 09/28/2016 07/05/2016 02/27/2016  WBC 10:3/mL 3.7 4.2 4.9  Hemoglobin 12.0 - 16.0 g/dL 11.2(A) 11.1(A) 10.8(A)  Hematocrit 36 - 46 % 34(A) 34(A) 33(A)  Platelets 150 - 399 K/L 169 178 222   CMP Latest Ref Rng & Units 09/28/2016 07/05/2016 03/14/2016  Glucose 70 - 99 mg/dL - - -  BUN 4 - 21 mg/dL 20 13 -  Creatinine 0.5 - 1.1 mg/dL 9.6(E) 4.5(W) -  Sodium  137 - 147 mmol/L 139 140 -  Potassium 3.4 - 5.3 mmol/L 3.9 4.4 -  Chloride 96 - 112 mEq/L - - -  CO2 19 - 32 mEq/L - - -  Calcium 8.4 - 10.5 mg/dL - - -  Total Protein 6.0 - 8.3 g/dL - - -  Total Bilirubin 0.3 - 1.2 mg/dL - - -  Alkaline Phos 25 - 125 U/L - 81 94  AST 13 - 35 U/L - 15 18  ALT 7 - 35 U/L - 19 18   Lab Results  Component Value Date   TSH 2.78 03/09/2016   Lab Results  Component Value Date   HGBA1C 5.5 10/25/2016   Lipid Panel     Component Value Date/Time   CHOL 180 07/05/2016   TRIG 115 07/05/2016   HDL 62 07/05/2016   CHOLHDL 2.7 07/25/2010 0036   VLDL 21 07/25/2010 0036   LDLCALC 95 07/05/2016     Assessment/plan  Acquired hypothyroidism Check TSH. Continue levothyroxine 25 g daily   Allergic rhinitis Currently on Claritin 10 mg daily as needed. Has not required it recently. Discontinue her Claritin. Continue her Flonase.  Seizure disorder Has remained seizure-free. Continue phenobarbital 64.8 mg twice a day and Dilantin 100 mg 3 times per day and monitor  Mood disorder with psychosis Her mood has been stable this visit. Followed by psychiatry services. Continue escitalopram 10 mg daily and Depakote 125 mg daily at bedtime. Monitor for side effects.     Oneal GroutMAHIMA Jerrica Thorman, MD Internal Medicine [facility] [address] [place], [place] [address] Cell  Phone (Monday-Friday 8 am - 5 pm): [phone] On Call: [phone] and follow prompts after 5 pm and on weekends Office Phone: [phone] Office Fax: [phone]

## 2018-04-23 ENCOUNTER — Encounter: Payer: Self-pay | Admitting: Internal Medicine

## 2018-06-25 ENCOUNTER — Non-Acute Institutional Stay: Payer: Medicare Other | Admitting: Internal Medicine

## 2018-11-20 ENCOUNTER — Other Ambulatory Visit: Payer: Self-pay

## 2018-11-20 ENCOUNTER — Non-Acute Institutional Stay: Payer: Medicare Other | Admitting: Internal Medicine

## 2019-02-05 ENCOUNTER — Non-Acute Institutional Stay: Payer: Medicare Other | Admitting: Internal Medicine

## 2019-02-06 ENCOUNTER — Other Ambulatory Visit: Payer: Self-pay

## 2019-03-12 ENCOUNTER — Non-Acute Institutional Stay: Payer: Medicare Other | Admitting: Internal Medicine

## 2019-03-12 DIAGNOSIS — Z515 Encounter for palliative care: Secondary | ICD-10-CM

## 2019-03-13 ENCOUNTER — Other Ambulatory Visit: Payer: Self-pay

## 2019-05-28 ENCOUNTER — Non-Acute Institutional Stay: Payer: Medicare Other | Admitting: Internal Medicine

## 2019-05-28 ENCOUNTER — Other Ambulatory Visit: Payer: Self-pay

## 2020-03-07 NOTE — Progress Notes (Signed)
Therapist, nutritional Palliative Care Consult Note Telephone: [phone]  Fax: [phone]  PATIENT NAME: Lauren Burch DOB: 1936/07/11 MRN: [id number]  PRIMARY CARE PROVIDER:   Dr. Lily Kocher REFERRING PROVIDER: Dr. Lily Kocher RESPONSIBLE PARTY:    Self and son  Lauren Burch [phone]    RECOMMENDATIONS and PLAN:  Palliative care encounter  Z51.5  1.  Advance care planning:  Previous code status of DNAR remains.  Pt's goals are for her to have longevity and continue to reside at [facility].      2.  Mood disorder:  At baseline and without behaviors.  Continue f/u by behavioral health.  3.  Seizure disorder:  At baseline and without noted seizures.  Continue Dilantine and Depakote.  F/u with neurology as needed.   Due to the COVID-19 crisis, this visit occurred from my office and was initiated and consented by patient and or family.  I spent 20 minutes providing this consultation,  from 12:30 to 12:50. More than 50% of the time in this consultation was spent coordinating communication with  Clinical staff and patient.   HISTORY OF PRESENT ILLNESS: Follow-up with Lauren Burch.  Clinical staff report that she is stronger and her appetite has improved.  She has been without falls or shortness of breath or seizure activities.  She is able to feed self and transfer to wheelchair with assistance.  She does require some assistance with her ADLs. Palliative Care was asked to help address goals of care.   CODE STATUS: DNR  PPS: 50% HOSPICE ELIGIBILITY/DIAGNOSIS: TBD  PAST MEDICAL HISTORY:  Past Medical History:  Diagnosis Date  . CVA (cerebral infarction)   . Seizures (HCC)   . TBI (traumatic brain injury) [facility])    Age 33 - Seizures    PERTINENT MEDICATIONS:  Outpatient Encounter Medications as of 03/12/2019  Medication Sig  . acetaminophen (TYLENOL) 325 MG tablet Take 650 mg by mouth every 4 (four) hours as needed for mild pain.  . Calcium Carbonate-Vitamin D (CALTRATE  600+D) 600-400 MG-UNIT per tablet Take 1 tablet by mouth 2 (two) times daily. For osteoporosis  . cholecalciferol (VITAMIN D) 1000 UNITS tablet Take 1,000 Units by mouth daily. Take 1 tablet by mouth daily for senile osteoporosis  . clopidogrel (PLAVIX) 75 MG tablet Take 75 mg by mouth daily. Take 1 tablet by mouth daily for CVA. Take with meals  . divalproex (DEPAKOTE SPRINKLE) 125 MG capsule Take 125 mg by mouth at bedtime.   Marland Kitchen escitalopram (LEXAPRO) 10 MG tablet Take 10 mg by mouth daily.  . fluconazole (DIFLUCAN) 200 MG tablet 1 by mouth once weekly (Saturday) for vulvovaginitis  . fluticasone (FLONASE) 50 MCG/ACT nasal spray Place 2 sprays into both nostrils daily.  Marland Kitchen gabapentin (NEURONTIN) 100 MG capsule Take 100 mg by mouth daily. In the morning  . gabapentin (NEURONTIN) 100 MG capsule Take 200 mg by mouth at bedtime.  Marland Kitchen guaiFENesin (ROBITUSSIN) 100 MG/5ML SOLN Take 10 mLs by mouth every 4 (four) hours as needed for cough.  . lactulose (CHRONULAC) 10 GM/15ML solution Take by mouth 2 (two) times daily. Give 30 cc  . levothyroxine (SYNTHROID, LEVOTHROID) 25 MCG tablet Take 25 mcg by mouth daily.   Marland Kitchen loperamide (IMODIUM A-D) 2 MG tablet Take 2 mg by mouth 4 (four) times daily as needed for diarrhea or loose stools.  Marland Kitchen loratadine (CLARITIN) 10 MG tablet Take 10 mg by mouth daily as needed for allergies.  . memantine (NAMENDA XR) 28 MG  CP24 24 hr capsule Take 28 mg by mouth daily.  . Menthol, Topical Analgesic, (BIOFREEZE) 4 % GEL Apply 1 application topically 2 (two) times daily. 8 am and 2 pm  . mirabegron ER (MYRBETRIQ) 25 MG TB24 tablet Take 25 mg by mouth daily.  . nitroGLYCERIN (NITROSTAT) 0.4 MG SL tablet Place 0.4 mg under the tongue every 5 (five) minutes as needed for chest pain (repeat for 3 doses prn).  Marland Kitchen PHENobarbital (LUMINAL) 64.8 MG tablet Take one tablet by mouth twice daily for seizures  . phenytoin (DILANTIN) 100 MG ER capsule Take 100 mg by mouth 3 (three) times daily.   .  polyethylene glycol (MIRALAX / GLYCOLAX) packet Take 17 g by mouth daily as needed. Mix 17 gm with 4-8 oz of liquid daily for constipation.  Marland Kitchen Propylene Glycol (SYSTANE BALANCE) 0.6 % SOLN Apply 1 drop to eye 2 (two) times daily. Instill 1 drop each eye twice daily for dry eyes  . ranitidine (ZANTAC) 75 MG tablet Take 75 mg by mouth daily as needed for heartburn.  . sennosides-docusate sodium (SENOKOT-S) 8.6-50 MG tablet Take 1 tablet by mouth at bedtime.   Marland Kitchen UNABLE TO FIND Med Name: Tucks Medica Pad Cooling. Apply 1 pad to anus up to 6 times a day for pain. May leave at bedside  . vitamin B-12 (CYANOCOBALAMIN) 500 MCG tablet Take 500 mcg by mouth daily.   No facility-administered encounter medications on file as of 03/12/2019.    PHYSICAL EXAM:   General: NAD, chronically ill appearing elderly female Cardiovascular: unable to assess Pulmonary:no increased respiratory effort Neurological: Alert and oriented x 3. Speech is fluid Psych:  Calm and cooperative  Margaretha Sheffield, NP-C

## 2020-12-15 ENCOUNTER — Non-Acute Institutional Stay: Payer: Medicare Other | Admitting: Student

## 2020-12-15 ENCOUNTER — Other Ambulatory Visit: Payer: Self-pay

## 2020-12-15 DIAGNOSIS — Z515 Encounter for palliative care: Secondary | ICD-10-CM

## 2020-12-15 NOTE — Progress Notes (Signed)
[facility] Consult Note Telephone: [phone]  Fax: [phone]  PATIENT NAME: Lauren Burch [address] [place] [address] [phone] (home)  DOB: [birth year] MRN: [id number]  PRIMARY CARE PROVIDER:    Dr. Keenan Bachelor  REFERRING PROVIDER:   Dr. Keenan Bachelor  RESPONSIBLE PARTY:   Extended Emergency Contact Information Primary Emergency Contact: Price,Frank Address: [address]          [place] [address] [place] Phone: [phone] Relation: Son  I met face to face with patient in the facility.  ASSESSMENT AND RECOMMENDATIONS:   Advance Care Planning: Visit at the request of Dr. Keenan Bachelor for palliative consult. Visit consisted of building trust and discussions on Palliative care medicine as specialized medical care for people living with serious illness, aimed at facilitating improved quality of life through symptoms relief, assisting with advance care planning and establishing goals of care. Palliative care will continue to provide support to patient, family and the medical team.  Goal of care: To maintain current level of care, symptoms managed.  Directives: DNR  Symptom Management:    Seizure disorder-no recent seizure activity. Continue dilantin and phenobarbital as directed. Monitor for safety/falls.   Constipation-patient with constipation; continue lactulose, Miralax as directed. Adequate fluids encouraged.   Follow up Palliative Care Visit: Palliative care will continue to follow for complex decision making and symptom management. Return in 8 weeks or prn.  Family /Caregiver/Community Supports: Palliative Medicine to continue to provide support.   I spent 25 minutes providing this consultation, time includes time spent with patient/family, chart review, provider coordination, and documentation. More than 50% of the time in this consultation was spent counseling and coordinating communication.    CHIEF COMPLAINT: Palliative Medicine follow up visit.  History obtained from review of EMR, discussion with primary team. Records reviewed and summarized below.  HISTORY OF PRESENT ILLNESS:  Lauren Burch is a 85 y.o. year old female with multiple medical problems including Seizure disorder, hx of CVA with hemiparesis, vascular dementia, mood disorder, hx of TBI. Palliative Care was asked to follow this patient by consultation request of Dr. Keenan Bachelor to help address advance care planning and goals of care. This is a follow up visit.  Patient reports doing well. Staff endorse patient being stable. She denies pain, shortness of breath, nausea. She does endorse constipation. She reports a fair appetite. No seizure activity reported. She is out of bed daily to w/c; she requires assistance with adl's. She reports sleeping well. No recent ER visits or hospitalizations.  CODE STATUS: DNR  PPS: 50%  HOSPICE ELIGIBILITY/DIAGNOSIS: TBD  ROS   General: NAD EYES: denies vision changes, wears glasses ENMT: denies dysphagia Cardiovascular: denies chest pain Pulmonary: denies cough, denies increased SOB Abdomen: endorses fair appetite, endorses constipation GU: denies dysuria MSK: no falls reported Skin: denies rashes or wounds Neurological: endorses weakness, denies pain, denies insomnia Psych: Endorses positive mood Heme/lymph/immuno: denies bruises, abnormal bleeding   Physical Exam: Weight: 152.3 pounds Pulse 78, resp 16, 128/70, sats 98% on room air Constitutional: NAD General: A & O x 3, non ill appearing, well groomed EYES: anicteric sclera, lids intact, no discharge  ENMT: intact hearing,oral mucous membranes moist CV: RRR, no LE edema Pulmonary: LCTA, no increased work of breathing, no cough Abdomen: BS normoactive x 4 GU: deferred MSK: non ambulatory Skin: warm and dry, no rashes or wounds on visible skin Neuro: Generalized weakness Psych: pleasant, non-anxious affect  today Hem/lymph/immuno: no widespread bruising  PAST MEDICAL HISTORY:  Past Medical History:  Diagnosis Date  . CVA (cerebral infarction)   . Seizures ([place])   . TBI (traumatic brain injury) ([place])    Age 85 - Seizures    SOCIAL HX:  Social History   Tobacco Use  . Smoking status: Former Research scientist (life sciences)  . Smokeless tobacco: Not on file  Substance Use Topics  . Alcohol use: Not on file   FAMILY HX: No family history on file.  ALLERGIES:  Allergies  Allergen Reactions  . Lasix [Furosemide]   . Latex      PERTINENT MEDICATIONS:  Outpatient Encounter Medications as of 12/15/2020  Medication Sig  . acetaminophen (TYLENOL) 325 MG tablet Take 650 mg by mouth every 4 (four) hours as needed for mild pain.  . Calcium Carbonate-Vitamin D (CALTRATE 600+D) 600-400 MG-UNIT per tablet Take 1 tablet by mouth 2 (two) times daily. For osteoporosis  . cholecalciferol (VITAMIN D) 1000 UNITS tablet Take 1,000 Units by mouth daily. Take 1 tablet by mouth daily for senile osteoporosis  . clopidogrel (PLAVIX) 75 MG tablet Take 75 mg by mouth daily. Take 1 tablet by mouth daily for CVA. Take with meals  . divalproex (DEPAKOTE SPRINKLE) 125 MG capsule Take 125 mg by mouth at bedtime.   Marland Kitchen escitalopram (LEXAPRO) 10 MG tablet Take 10 mg by mouth daily.  . fluconazole (DIFLUCAN) 200 MG tablet 1 by mouth once weekly (Saturday) for vulvovaginitis  . fluticasone (FLONASE) 50 MCG/ACT nasal spray Place 2 sprays into both nostrils daily.  Marland Kitchen gabapentin (NEURONTIN) 100 MG capsule Take 100 mg by mouth daily. In the morning  . gabapentin (NEURONTIN) 100 MG capsule Take 200 mg by mouth at bedtime.  Marland Kitchen guaiFENesin (ROBITUSSIN) 100 MG/5ML SOLN Take 10 mLs by mouth every 4 (four) hours as needed for cough.  . lactulose (CHRONULAC) 10 GM/15ML solution Take by mouth 2 (two) times daily. Give 30 cc  . levothyroxine (SYNTHROID, LEVOTHROID) 25 MCG tablet Take 25 mcg by mouth daily.   Marland Kitchen loperamide (IMODIUM A-D) 2 MG tablet Take 2 mg  by mouth 4 (four) times daily as needed for diarrhea or loose stools.  Marland Kitchen loratadine (CLARITIN) 10 MG tablet Take 10 mg by mouth daily as needed for allergies.  . memantine (NAMENDA XR) 28 MG CP24 24 hr capsule Take 28 mg by mouth daily.  . Menthol, Topical Analgesic, (BIOFREEZE) 4 % GEL Apply 1 application topically 2 (two) times daily. 8 am and 2 pm  . mirabegron ER (MYRBETRIQ) 25 MG TB24 tablet Take 25 mg by mouth daily.  . nitroGLYCERIN (NITROSTAT) 0.4 MG SL tablet Place 0.4 mg under the tongue every 5 (five) minutes as needed for chest pain (repeat for 3 doses prn).  Marland Kitchen PHENobarbital (LUMINAL) 64.8 MG tablet Take one tablet by mouth twice daily for seizures  . phenytoin (DILANTIN) 100 MG ER capsule Take 100 mg by mouth 3 (three) times daily.   . polyethylene glycol (MIRALAX / GLYCOLAX) packet Take 17 g by mouth daily as needed. Mix 17 gm with 4-8 oz of liquid daily for constipation.  Marland Kitchen Propylene Glycol (SYSTANE BALANCE) 0.6 % SOLN Apply 1 drop to eye 2 (two) times daily. Instill 1 drop each eye twice daily for dry eyes  . ranitidine (ZANTAC) 75 MG tablet Take 75 mg by mouth daily as needed for heartburn.  . sennosides-docusate sodium (SENOKOT-S) 8.6-50 MG tablet Take 1 tablet by mouth at bedtime.   Marland Kitchen UNABLE TO FIND Med Name: [place] Pad Cooling.  Apply 1 pad to anus up to 6 times a day for pain. May leave at bedside  . vitamin B-12 (CYANOCOBALAMIN) 500 MCG tablet Take 500 mcg by mouth daily.   No facility-administered encounter medications on file as of 12/15/2020.     Thank you for the opportunity to participate in the care of Ms. Kassem. The palliative care team will continue to follow. Please call our office at [phone] if we can be of additional assistance.   Charlann Boxer, NP

## 2021-11-22 ENCOUNTER — Non-Acute Institutional Stay: Payer: Commercial Managed Care - HMO | Admitting: Internal Medicine

## 2021-11-22 ENCOUNTER — Other Ambulatory Visit: Payer: Self-pay

## 2021-11-22 ENCOUNTER — Encounter: Payer: Self-pay | Admitting: Internal Medicine

## 2021-11-22 VITALS — BP 112/72 | HR 83 | Resp 18 | Wt 158.2 lb

## 2021-11-22 DIAGNOSIS — F01B3 Vascular dementia, moderate, with mood disturbance: Secondary | ICD-10-CM

## 2021-11-22 DIAGNOSIS — Z515 Encounter for palliative care: Secondary | ICD-10-CM

## 2021-11-22 DIAGNOSIS — K5909 Other constipation: Secondary | ICD-10-CM

## 2021-11-22 DIAGNOSIS — G40909 Epilepsy, unspecified, not intractable, without status epilepticus: Secondary | ICD-10-CM

## 2021-11-22 NOTE — Progress Notes (Signed)
[facility] Follow-Up Visit Telephone: [phone]  Fax: [phone]   Date of encounter: 11/22/21 1:47 PM PATIENT NAME: Lauren Burch [place] [address]   [phone] (home)  DOB: 07-May-1936 MRN: [id number] PRIMARY CARE PROVIDER:    Martie Lee, NP, Claria Dice, Jules Husbands, MD  REFERRING PROVIDER:   Charyl Bigger   RESPONSIBLE PARTY:    Contact Information     Name Relation Home Work Mobile   Lyndel Safe [phone]          I met face to face with patient and family in [facility] facility. Palliative Care was asked to follow this patient by consultation request of Vaughan Basta from [facility] to address advance care planning and complex medical decision making. This is follow-up visit.                                     ASSESSMENT AND PLAN / RECOMMENDATIONS:   Advance Care Planning/Goals of Care: Goals include to maximize quality of life and symptom management. Patient/health care surrogate gave his/her permission to discuss.Our advance care planning conversation included a discussion about:    The value and importance of advance care planning  Experiences with loved ones who have been seriously ill or have died  Exploration of personal, cultural or spiritual beliefs that might influence medical decisions  Exploration of goals of care in the event of a sudden injury or illness  Identification  of a healthcare agent--Donna Marcello Moores; son Danise Mina  Review and updating or creation of an  advance directive document . Decision not to resuscitate or to de-escalate disease focused treatments due to poor prognosis. CODE STATUS:  DNR; MOST:  DNR, comfort measures, abx to be determined, IVF trial, and no feeding tube  Symptom Management/Plan: Seizure Disorder: No recent seizure activity reported. Plan: Continue Dilantin and Phenobarbitol. Continue seizure precautions and fall precautions.      Vascular Dementia with mood  disorder: Alert and oriented to person/place/situation. Sleeping well at night. Pleasant and engaging. Reports positive mood most of the time with some feelings of sadness on occasion. Enjoys writing poetry and attending facility social activities. Plan: Continue Namenda and Risperdal. Continue to provide opportunities for social engagement.      Constipation: Patient reports good appetite and bowel movements are at least every other day. Plan: Continue Lactulose, Miralax, and Senna-S. Encourage adequate hydration and mobility.      Palliative Care Encounter: Continue to follow up with patient goals of care, symptom management, and advanced care planning.   ?   Follow up Palliative Care Visit: Palliative care will continue to follow for complex medical decision making, advance care planning, and clarification of goals. Return 8 weeks or prn.   ?   This visit was coded based on medical decision making (MDM).   ?   PPS:?40%   ?   HOSPICE ELIGIBILITY/DIAGNOSIS:?TBD   ?   Chief Complaint: Follow-up palliative visit   ?   HISTORY OF PRESENT ILLNESS:??[name]?is an 86 y.o.?year old female?with PMH of?seizure disorder, h/o CVA, vascular dementia, mood disorder, h/o traumatic brain injury, PVD seen today for palliative care follow up in SNF. Patient has been doing quite well. No seizure activity reported. Alert, pleasant, and verbally engaging. Endorses positive mood most of the time but does feel sad occasionally. Enjoys writing poetry and participating in facility social activities. Sleeping well at night. Reports  some increasing "smoky" vision in right eye and plans to go to eye appointment soon. Has some cough with eating/drinking. Denies dyspnea. Has seasonal allergies, on Claritin and Flonase. Good appetite, eating 100% of meals, with weight gain 6 lbs since last visit. Reports bowel management has been good with current plan - going at least once every other day. Incontinent urine,  wears briefs. Needs assist with ADLs and transfers. ?Does ambulate with walker with therapy short distances. Chronic RLE pain well managed with lidocaine patch and Tylenol PRN.    ?   History obtained from review of EMR, discussion with primary team, and interview with family, facility staff/caregiver and/or?Ms.?[place].    ?   I reviewed available labs, medications, imaging, studies and related documents from the EMR. ?Records reviewed and summarized above.    ?   ROS   ?   General: NAD   EYES: Right eye "smokey" vision changes - plans to follow up with eye appointment   ENMT: denies dysphagia   Cardiovascular: denies chest pain, denies DOE   Pulmonary: occasional cough with eating/drinking, denies increased SOB   Abdomen: endorses good appetite, denies constipation, endorses continence of bowel   GU: denies dysuria, endorses incontinence of urine   MSK: ?denies increased weakness,?no falls reported   Skin: denies rashes or wounds  Neurological: chronic RLE pain well managed with Lidocaine patch and Tylenol PRN, denies insomnia   Psych: Endorses positive mood   Heme/lymph/immuno: denies bruises, abnormal bleeding   ?   Physical Exam:   Current and past weights:??152.3 lbs in 4/22, 155.2 lbs in 2/23, and 158.2 lbs 3/23   Constitutional: NAD   General: frail appearing,?WWNL   EYES: anicteric sclera, lids intact, no discharge, wears glasses   ENMT: intact hearing, oral mucous membranes moist, dentition intact   CV: S1S2, RRR,?chronic?LE soft edema and tenderness   Pulmonary: LLL crackle, no increased work of breathing,?no?cough observed, room air   Abdomen: intake 100%, normo-active BS + 4 quadrants, soft and non tender, no ascites   GU: deferred   MSK: no sarcopenia, moves all extremities, needs assist for transfers, using wheelchair for mobility but does ambulate short distances with walker and PT   Skin: warm and dry, no rashes or wounds on visible skin    Neuro: ?no generalized weakness, mild cognitive impairment   Psych: non-anxious affect, A and O x 3   Hem/lymph/immuno: no widespread bruising  CURRENT PROBLEM LIST:  Patient Active Problem List   Diagnosis Date Noted   Mood disorder with psychosis (HCC) 12/05/2016   Hemiplegia affecting left side in left-dominant patient as late effect of cerebrovascular disease (HCC) 05/31/2016   Anemia of chronic kidney failure 04/25/2016   CKD (chronic kidney disease) stage 2, GFR 60-89 ml/min 04/25/2016   History of CVA (cerebrovascular accident) 03/20/2016   Age related osteoporosis 11/18/2015   Vascular dementia (HCC) 11/18/2015   Functional urinary incontinence 11/18/2015   Constipation, chronic 11/18/2015   Coronary artery disease involving native coronary artery of native heart with angina pectoris (HCC) 11/18/2015   Unsteady gait 09/19/2015   Peripheral polyneuropathy 09/19/2015   OAB (overactive bladder) 09/19/2015   Frequent falls 06/03/2015   Major depressive disorder, recurrent episode, mild (HCC) 02/25/2015   Vitamin D deficiency 01/27/2015   Esophageal reflux 01/27/2015   Vaginitis and vulvovaginitis 01/27/2015   Rhinitis, allergic 01/04/2015   Anemia of chronic disease 01/04/2015   Acquired hypothyroidism 01/04/2015   Thyroid activity decreased 12/09/2014   B12 deficiency anemia 12/09/2014  Hyperlipidemia 08/16/2014   Seizure disorder ([place]) 08/16/2014   CVA (cerebral vascular accident) ([place]) 08/16/2014   CN (constipation) 08/16/2014   Essential tremor 08/16/2014   Allergy to pollen 05/05/2013   Other convulsions 03/05/2013   Late effects of cerebrovascular disease 03/05/2013   Pure hypercholesterolemia 03/05/2013   Constipation 03/05/2013   PAST MEDICAL HISTORY:  Active Ambulatory Problems    Diagnosis Date Noted   Other convulsions 03/05/2013   Late effects of cerebrovascular disease 03/05/2013   Pure hypercholesterolemia 03/05/2013   Constipation 03/05/2013    Allergy to pollen 05/05/2013   Hyperlipidemia 08/16/2014   Seizure disorder ([place]) 08/16/2014   CVA (cerebral vascular accident) ([place]) 08/16/2014   CN (constipation) 08/16/2014   Essential tremor 08/16/2014   Thyroid activity decreased 12/09/2014   B12 deficiency anemia 12/09/2014   Rhinitis, allergic 01/04/2015   Anemia of chronic disease 01/04/2015   Acquired hypothyroidism 01/04/2015   Vitamin D deficiency 01/27/2015   Esophageal reflux 01/27/2015   Vaginitis and vulvovaginitis 01/27/2015   Major depressive disorder, recurrent episode, mild ([place]) 02/25/2015   Frequent falls 06/03/2015   Unsteady gait 09/19/2015   Peripheral polyneuropathy 09/19/2015   OAB (overactive bladder) 09/19/2015   Age related osteoporosis 11/18/2015   Vascular dementia ([place]) 11/18/2015   Functional urinary incontinence 11/18/2015   Constipation, chronic 11/18/2015   Coronary artery disease involving native coronary artery of native heart with angina pectoris ([place]) 11/18/2015   History of CVA (cerebrovascular accident) 03/20/2016   Anemia of chronic kidney failure 04/25/2016   CKD (chronic kidney disease) stage 2, GFR 60-89 ml/min 04/25/2016   Hemiplegia affecting left side in left-dominant patient as late effect of cerebrovascular disease ([place]) 05/31/2016   Mood disorder with psychosis ([place]) 12/05/2016   Resolved Ambulatory Problems    Diagnosis Date Noted   GERD (gastroesophageal reflux disease) 05/05/2013   Past Medical History:  Diagnosis Date   CVA (cerebral infarction)    Seizures ([place])    TBI (traumatic brain injury)    SOCIAL HX:  Social History   Tobacco Use   Smoking status: Former   Smokeless tobacco: Not on file  Substance Use Topics   Alcohol use: Not on file     ALLERGIES:  Allergies  Allergen Reactions   Lasix [Furosemide]    Latex      PERTINENT MEDICATIONS:  Signed Electronically Prescription amantadine HCl tablet; 100 mg; amt: 1/2 a Tab; oral Special Instructions:  AMANTADINE 100 mg Tab give 1/2 a Tab to = 50 mg Po BID -TREMORS Twice A Day 09:00 AM, 09:00 PM 11/20/2020 Open Ended Medications        Signed Electronically Prescription calcium 500 - Vit D3 $Remo'200mg'emhHB$  tablet; 500/$RemoveBefo'200mg'hHdTvciCQDM$ ; amt: 1 Tab; oral Special Instructions: 1 tablet po BID Twice A Day 09:00 AM, 09:00 PM 09/25/2020 Open Ended Medications        Signed Electronically Prescription Claritin (loratadine) [OTC] tablet; 10 mg; amt: 1 TABLET; oral Once A Day 09:00 PM 09/04/2021 Open Ended Medications        Signed Electronically Prescription clopidogrel tablet; 75 mg; amt: 1 Tab; oral Special Instructions: 1 tab = $Rem'75mg'sOVO$  Po QD for CVA Once A Day 09:00 AM 09/25/2020 Open Ended Medications        Signed Electronically Prescription cyanocobalamin (vitamin B-12) tablet; 250 mcg; amt: 1 TAB; oral Special Instructions: GIVE 250 MG PO Q DAY. SUPPLEMENT. Once A Day 09:00 AM 08/19/2020 Open Ended Medications        Signed Electronically Prescription Dilantin Extended (phenytoin sodium extended)  capsule; 100 mg; amt: 1 Cap; oral Special Instructions: Dilantin 100 mg Cap 1 Cap Po BID TO PREVENT SEIZURES Twice A Day 09:00 AM, 01:00 PM 09/25/2020 Open Ended Medications        Signed Electronically Prescription Dilantin Extended (phenytoin sodium extended) capsule; 100 mg; amt: 2 Caps; oral Special Instructions: Dilantin l00 mg Cap give 2 Caps to = 200 mg Po QHS At Bedtime 09:00 PM 09/25/2020 Open Ended Medications        Signed Electronically Prescription Flonase Allergy Relief (fluticasone) [OTC] spray,suspension; 50 mcg/actuation; amt: 2 Sprays; nasal Special Instructions: 2 sprays each nostril daily for allergies Once A Day 09:00 AM 09/25/2020 Open Ended Medications        Signed Electronically Prescription lactulose solution; 10 gram/15 mL; amt: 30 ml; oral Special Instructions: Give 30cc po BID for constipation Twice A Day 09:00 AM, 09:00 PM 09/25/2020 Open Ended Medications        Signed on  Paper Prescription levothyroxine tablet; 25 mcg; amt: 25 mcg; oral Special Instructions: 1 tab = 43mcg po QD Hypothyroidism Once A Day 06:00 AM 11/15/2017 Open Ended Medications        Signed Electronically Prescription lidocaine [OTC] adhesive patch,medicated; 4 %; amt: 1 patch; topical Special Instructions: R shin (lengthwise) qam , remove qhs-pain Once A Day 09:00 AM 10/17/2021 Open Ended Medications        Signed Electronically Prescription Miralax (polyethylene glycol 3350) [OTC] powder; 17 gram/dose; amt: 17 grams; oral Special Instructions: Miralax 17 grams in 6-8 oz of fluid Po QD -Constipation Once A Day 09:00 PM 10/17/2020 Open Ended Medications        Signed Electronically Prescription Namenda XR (memantine) capsule,sprinkle,ER 24hr; 14 mg; amt: 1 capsule; oral Special Instructions: Take 1 CAP PO EVERY OTHER DAY - DEMENTIA Once A Day Every Other Day 09:00 AM 11/30/2020 Open Ended Medications        Signed on Paper Prescription nitroglycerin tablet, sublingual; 0.4 mg; amt: 0.4 mg; sublingual Special Instructions: 1 tab SL q15minutes PRN Chest pain with max dose of 3. Notify MD if no relief, As Needed PRN 1, PRN 2, PRN 3 09/25/2017 Open Ended Medications        Signed Electronically Prescription Ocusoft Eyelid Cleansing Pads (miscellaneous medical supply) [OTC] pad; - ; amt: 1 PAD; miscellaneous Special Instructions: OCUSOLFT EYELID SCRUBS to each eyelid BID Twice A Day 09:00 AM, 09:00 PM 09/04/2021 Open Ended Medications        Signed Electronically Prescription phenobarbital - Schedule IV tablet; 64.8 mg; amt: 1 TAB; oral Special Instructions: PHENOBABITAL 64.8 MG Tab GIVE 1 Tab PO BID. SEIZURE. Twice A Day 09:00 AM, 09:00 PM 09/25/2020 Open Ended Medications        Signed Electronically Prescription Risperdal (risperidone) tablet; 1 mg; amt: 1 TAB; oral Special Instructions: RISPERDAL 1 MG TAB 1 TAB Po QAM-SCHIZOAFFECTIVE DISORDER Once A Day 09:00 AM 04/10/2021 Open  Ended Medications        Signed Electronically Prescription Risperdal (risperidone) tablet; 1 mg; amt: 1.5 TABS; oral Special Instructions: RISPERDAL 1 MG TAB GIVE 1 & 1/2 TABS TO = 1.5 MG Po QHS -SCHIZOAFFECTIVE DISORDER At Bedtime 09:00 PM 04/10/2021 Open Ended Medications        Signed Electronically Prescription Senna-S (sennosides-docusate sodium) [OTC] tablet; 8.6-50 mg; amt: 1 tab; oral Special Instructions: 1 tab po QD for constipation Once A Day 09:00 PM 12/12/2019 Open Ended Medications        Signed Electronically Prescription Tylenol (acetaminophen) [OTC] tablet; 325 mg; amt: 2  TABLETS; oral Special Instructions: Take 2 tablets by mouth every 6 hours as needed for pain As Needed PRN 1, PRN 2, PRN 3, PRN 4 08/07/2021 Open Ended Medications   Thank you for the opportunity to participate in the care of Ms. Prange.  The palliative care team will continue to follow. Please call our office at [phone] if we can be of additional assistance.   Hollace Kinnier, DO  COVID-19 PATIENT SCREENING TOOL Asked and negative response unless otherwise noted:  Have you had symptoms of covid, tested positive or been in contact with someone with symptoms/positive test in the past 5-10 days? no

## 2022-08-10 ENCOUNTER — Non-Acute Institutional Stay: Payer: Commercial Managed Care - HMO | Admitting: Family Medicine

## 2022-08-10 ENCOUNTER — Encounter: Payer: Self-pay | Admitting: Family Medicine

## 2022-08-10 VITALS — BP 128/88 | HR 90 | Temp 97.6°F | Wt 172.2 lb

## 2022-08-10 DIAGNOSIS — F01B3 Vascular dementia, moderate, with mood disturbance: Secondary | ICD-10-CM

## 2022-08-10 DIAGNOSIS — R1310 Dysphagia, unspecified: Secondary | ICD-10-CM

## 2022-08-10 DIAGNOSIS — R0602 Shortness of breath: Secondary | ICD-10-CM

## 2022-08-10 NOTE — Progress Notes (Unsigned)
[facility] Palliative [facility] Consult Note Telephone: [phone]  Fax: [phone]    Date of encounter: 08/10/22 12:52 PM PATIENT NAME: Lauren Burch [place] [address]   [phone] (home)  DOB: December 20, 1935 MRN: [id number] PRIMARY CARE PROVIDER:    Raymondo Band, MD,  [address] [place] [address] [phone]  REFERRING PROVIDER:   Raymondo Band, MD [address] [place],  [place] [address] [phone]  RESPONSIBLE PARTY:    Contact Information     Name Relation Home Work Mobile   Lauren Burch [phone]          I met face to face with patient in [facility] facility. Palliative Care was asked to follow this patient by consultation request of  Raymondo Band, MD to address advance care planning and complex medical decision making. This is a follow up visit   ASSESSMENT , SYMPTOM MANAGEMENT AND PLAN / RECOMMENDATIONS:   Shortness of breath Recommend obtaining Covid 19 PCR and if positive quarantine of pt for 5 days if no increased symptoms, 10-14 days with fever or increased symptoms. If Covid PCR negative, recommend ST eval of swallow and possible CXR to check for aspiration.   Dysphagia unspecified Observed coughing while eating pound cake intermittently ST eval and treat as per above Upright for all intake, recommend adding sauces or gravies to moisten dry foods to aid in swallow. Alternate sips and bites, use of chin tuck to aid swallow When eating dry foods, take a drink of liquids first, swallow then eat bite of food and follow with another sip of liquids.   Moderate vascular dementia with mood disturbance FAST 7 score 6e however this is more likely a secondary effect of the hx of CVA. Encourage continued socialization if Covid PCR negative and involvement in activities   Advance Care Planning/Goals of Care: Goals include to maximize quality of life and symptom management.   Identification of a  healthcare agent-Lauren Burch [facility] POA Review of an advance directive document-MOST . Decision not to resuscitate or to de-escalate disease focused treatments due to poor prognosis. CODE STATUS:  DNR MOST as of 05/16/21: DNR/DNI with comfort measures Use of antibiotics and IV fluids on a case by case, time limited basis No feeding tube.   (Updated in [place])   Follow up Palliative Care Visit: Palliative care will continue to follow for complex medical decision making, advance care planning, and clarification of goals. Return 4 weeks or prn.   This visit was coded based on medical decision making (MDM).  PPS: 50%  HOSPICE ELIGIBILITY/DIAGNOSIS: TBD  Chief Complaint:  Palliative Care is continuing to follow patient for chronic medical management in pt with hx of CVA with left sided hemiplegia in setting of dementia.   HISTORY OF PRESENT ILLNESS:  Lauren Burch is a 86 y.o. year old female with hx of seizures and CVA with left sided hemiplegia .  Nursing states no recent seizures or falls.  Pt is incontinent of bowel and bladder, needs assistance with bathing and dressing but is able to feed herself. Pt c/o SOB with "singing" and tickle in her throat for the last month.  Denies fever, chills, has occasional non-productive cough.  Denies pain, n/v dysuria, constipation.    History obtained from review of EMR, discussion with primary team, and interview with facility staff and/or Lauren Burch.  I reviewed EMR for available labs, medications, imaging, studies and related documents.  There are no new records since  last visit.   ROS General: NAD EYES: denies vision changes ENMT: endorses occasional dysphagia particularly with drier foods Cardiovascular: denies chest pain, denies DOE Pulmonary: endorses non-productive cough and situational SOB Abdomen: endorses good appetite, denies constipation, endorses incontinence of bowel GU: denies dysuria, endorses incontinence of urine MSK:  uses wc for  mobility,  no falls reported Skin: denies rashes or wounds Neurological: denies pain and seizures, denies insomnia Psych: Endorses positive mood Heme/lymph/immuno: denies bruises, abnormal bleeding  Physical Exam: Current and past weights:  158 lbs 3.2 ounces on 11/22/21, current weight as of 07/11/22 172 lbs 3.2 ounces Constitutional: NAD General: WD/obese  ENMT: intact hearing CV: S1S2, RRR, 2+ bLE edema Pulmonary: CTAB, diminished, no increased work of breathing, no cough, room air Abdomen: normo-active BS + 4 quadrants, soft and non tender GU: deferred MSK: no sarcopenia, moves all extremities, wc bound Skin: warm and dry, no rashes or wounds on visible skin Neuro:  noted generalized weakness and mild cognitive impairment Psych: non-anxious affect, A and O x 3 Hem/lymph/immuno: no widespread bruising   Thank you for the opportunity to participate in the care of Lauren Burch.  The palliative care team will continue to follow. Please call our office at [phone] if we can be of additional assistance.   Marijo Conception, FNP -C  COVID-19 PATIENT SCREENING TOOL Asked and negative response unless otherwise noted:   Have you had symptoms of covid, tested positive or been in contact with someone with symptoms/positive test in the past 5-10 days?  Unknown, new cases in facility

## 2022-08-12 DIAGNOSIS — R0602 Shortness of breath: Secondary | ICD-10-CM | POA: Insufficient documentation

## 2022-11-08 ENCOUNTER — Non-Acute Institutional Stay: Payer: Medicare Other | Admitting: Family Medicine

## 2022-11-08 ENCOUNTER — Encounter: Payer: Self-pay | Admitting: Family Medicine

## 2022-11-08 VITALS — BP 128/68 | HR 69 | Temp 97.7°F | Resp 18

## 2022-11-08 DIAGNOSIS — K5909 Other constipation: Secondary | ICD-10-CM

## 2022-11-08 DIAGNOSIS — R296 Repeated falls: Secondary | ICD-10-CM

## 2022-11-08 DIAGNOSIS — F01B3 Vascular dementia, moderate, with mood disturbance: Secondary | ICD-10-CM

## 2022-11-08 NOTE — Progress Notes (Signed)
[facility] Consult Note Telephone: [phone]  Fax: [phone]   Date of encounter: 11/08/22 2:07 PM PATIENT NAME: Lauren Burch [id number]   [phone] (home)  DOB: 1936/01/24 MRN: [id number] PRIMARY CARE PROVIDER:    Raymondo Band, MD,  [address] [place] [address] [phone]  REFERRING PROVIDER:   Raymondo Band, MD [address] [place],  [place] [address] [phone]  Emergency Contact:    Contact Information     Name Relation Home Work Mobile   Lauren Burch [phone]          I met face to face with patient in [facility]. Palliative Care was asked to follow this patient by consultation request of Lauren Band, MD to address advance care planning and complex medical decision making. This is a follow up visit.  ADVANCE CARE PLANNING/GOALS OF CARE:  HEALTH CARE POA AND PHONE NUMBER:  Lauren Burch   CODE STATUS: DNR MOST as of 05/16/21: DNR/DNI with comfort measures Use of antibiotics and IV fluids on a case by case, time limited basis No feeding tube.       ASSESSMENT AND / RECOMMENDATIONS:  PPS: 40%  Moderate vascular dementia with mood disturbance FAST 7 score, difficult to determine with cerebrovascular disease/hx of stroke with residual deficits Recommend to continue Namenda for dementia, Depakote and Lexapro for mood.  2.  Chronic constipation Continue Lactulose and sennokot, consider d/c imodium, claritin and consider d/c or reduction in frequency of Myrbetriq due to anticholinergic burden.  3.  Falls Recommend PT eval pt and stedy to see if there is method to improve transfer and decrease fall risk.    Follow up Palliative Care Visit:  Palliative Care continuing to follow up by monitoring for changes in appetite, weight, functional and cognitive status for chronic disease progression and management in agreement with  patient's stated goals of care. Next visit in 4 weeks or prn.  This visit was coded based on medical decision making (MDM).  Chief Complaint  Palliative Care is continuing to follow patient for chronic medical management in setting of dementia.   HISTORY OF PRESENT ILLNESS: Lauren Burch is a 87 y.o. year old female with dementia and hemiplegia following hx of CVA.  Pt had fall from stedy while toileting and doing pericare last night. She fell to her right side and denies injury.  Remains a little sore today. Facility staff mentioned that she had fall yesterday without injury, that overall she is very pleasant but about once a week she is observed to be tearful thinking that other residents are "talking about her". She c/o some straining with bowel movements.   ACTIVITIES OF DAILY LIVING: CONTINENT OF BLADDER/BOWEL? No BATHING/DRESSING/FEEDING:  independent with feeding, help with bathing/dressing  MOBILITY:  AMBULATORY WITH ASSISTIVE DEVICE: No WHEELCHAIR or BEDBOUND? Yes, uses lift/stedy for transfers  APPETITE? good Weight:  177.4 on 10/17/22  CURRENT PROBLEM LIST:  Patient Active Problem List   Diagnosis Date Noted   Shortness of breath 08/12/2022   Mood disorder with psychosis ([place]) 12/05/2016   Hemiplegia affecting left side in left-dominant patient as late effect of cerebrovascular disease ([place]) 05/31/2016   Anemia of chronic kidney failure 04/25/2016   CKD (chronic kidney disease) stage 2, GFR 60-89 ml/min 04/25/2016   History of CVA (cerebrovascular accident) 03/20/2016   Age related osteoporosis 11/18/2015   Vascular dementia ([place]) 11/18/2015   Functional urinary incontinence 11/18/2015  Constipation, chronic 11/18/2015   Coronary artery disease involving native coronary artery of native heart with angina pectoris ([place]) 11/18/2015   Unsteady gait 09/19/2015   Peripheral polyneuropathy 09/19/2015   OAB (overactive bladder) 09/19/2015   Frequent falls 06/03/2015   Major  depressive disorder, recurrent episode, mild ([place]) 02/25/2015   Vitamin D deficiency 01/27/2015   Esophageal reflux 01/27/2015   Vaginitis and vulvovaginitis 01/27/2015   Rhinitis, allergic 01/04/2015   Anemia of chronic disease 01/04/2015   Acquired hypothyroidism 01/04/2015   Thyroid activity decreased 12/09/2014   B12 deficiency anemia 12/09/2014   Hyperlipidemia 08/16/2014   Seizure disorder ([place]) 08/16/2014   CVA (cerebral vascular accident) ([place]) 08/16/2014   CN (constipation) 08/16/2014   Essential tremor 08/16/2014   Allergy to pollen 05/05/2013   Other convulsions 03/05/2013   Late effects of cerebrovascular disease 03/05/2013   Pure hypercholesterolemia 03/05/2013   Constipation 03/05/2013   PAST MEDICAL HISTORY:  Active Ambulatory Problems    Diagnosis Date Noted   Other convulsions 03/05/2013   Late effects of cerebrovascular disease 03/05/2013   Pure hypercholesterolemia 03/05/2013   Constipation 03/05/2013   Allergy to pollen 05/05/2013   Hyperlipidemia 08/16/2014   Seizure disorder ([place]) 08/16/2014   CVA (cerebral vascular accident) ([place]) 08/16/2014   CN (constipation) 08/16/2014   Essential tremor 08/16/2014   Thyroid activity decreased 12/09/2014   B12 deficiency anemia 12/09/2014   Rhinitis, allergic 01/04/2015   Anemia of chronic disease 01/04/2015   Acquired hypothyroidism 01/04/2015   Vitamin D deficiency 01/27/2015   Esophageal reflux 01/27/2015   Vaginitis and vulvovaginitis 01/27/2015   Major depressive disorder, recurrent episode, mild ([place]) 02/25/2015   Frequent falls 06/03/2015   Unsteady gait 09/19/2015   Peripheral polyneuropathy 09/19/2015   OAB (overactive bladder) 09/19/2015   Age related osteoporosis 11/18/2015   Vascular dementia ([place]) 11/18/2015   Functional urinary incontinence 11/18/2015   Constipation, chronic 11/18/2015   Coronary artery disease involving native coronary artery of native heart with angina pectoris ([place])  11/18/2015   History of CVA (cerebrovascular accident) 03/20/2016   Anemia of chronic kidney failure 04/25/2016   CKD (chronic kidney disease) stage 2, GFR 60-89 ml/min 04/25/2016   Hemiplegia affecting left side in left-dominant patient as late effect of cerebrovascular disease ([place]) 05/31/2016   Mood disorder with psychosis ([place]) 12/05/2016   Shortness of breath 08/12/2022   Resolved Ambulatory Problems    Diagnosis Date Noted   GERD (gastroesophageal reflux disease) 05/05/2013   Past Medical History:  Diagnosis Date   CVA (cerebral infarction)    Seizures ([place])    TBI (traumatic brain injury) ([place])        Preferred Pharmacy: ALLERGIES:  Allergies  Allergen Reactions   Lasix [Furosemide]    Latex      PERTINENT MEDICATIONS:  Outpatient Encounter Medications as of 11/08/2022  Medication Sig   acetaminophen (TYLENOL) 325 MG tablet Take 650 mg by mouth every 4 (four) hours as needed for mild pain.   Calcium Carbonate-Vitamin D (CALTRATE 600+D) 600-400 MG-UNIT per tablet Take 1 tablet by mouth 2 (two) times daily. For osteoporosis   cholecalciferol (VITAMIN D) 1000 UNITS tablet Take 1,000 Units by mouth daily. Take 1 tablet by mouth daily for senile osteoporosis   clopidogrel (PLAVIX) 75 MG tablet Take 75 mg by mouth daily. Take 1 tablet by mouth daily for CVA. Take with meals   divalproex (DEPAKOTE SPRINKLE) 125 MG capsule Take 125 mg by mouth at bedtime.    escitalopram (LEXAPRO) 10 MG tablet Take  10 mg by mouth daily.   fluconazole (DIFLUCAN) 200 MG tablet 1 by mouth once weekly (Saturday) for vulvovaginitis   fluticasone (FLONASE) 50 MCG/ACT nasal spray Place 2 sprays into both nostrils daily.   gabapentin (NEURONTIN) 100 MG capsule Take 100 mg by mouth daily. In the morning   gabapentin (NEURONTIN) 100 MG capsule Take 200 mg by mouth at bedtime.   guaiFENesin (ROBITUSSIN) 100 MG/5ML SOLN Take 10 mLs by mouth every 4 (four) hours as needed for cough.   lactulose (CHRONULAC)  10 GM/15ML solution Take by mouth 2 (two) times daily. Give 30 cc   levothyroxine (SYNTHROID, LEVOTHROID) 25 MCG tablet Take 25 mcg by mouth daily.    loperamide (IMODIUM A-D) 2 MG tablet Take 2 mg by mouth 4 (four) times daily as needed for diarrhea or loose stools.   loratadine (CLARITIN) 10 MG tablet Take 10 mg by mouth daily as needed for allergies.   memantine (NAMENDA XR) 28 MG CP24 24 hr capsule Take 28 mg by mouth daily.   Menthol, Topical Analgesic, (BIOFREEZE) 4 % GEL Apply 1 application topically 2 (two) times daily. 8 am and 2 pm   mirabegron ER (MYRBETRIQ) 25 MG TB24 tablet Take 25 mg by mouth daily.   nitroGLYCERIN (NITROSTAT) 0.4 MG SL tablet Place 0.4 mg under the tongue every 5 (five) minutes as needed for chest pain (repeat for 3 doses prn).   PHENobarbital (LUMINAL) 64.8 MG tablet Take one tablet by mouth twice daily for seizures   phenytoin (DILANTIN) 100 MG ER capsule Take 100 mg by mouth 3 (three) times daily.    polyethylene glycol (MIRALAX / GLYCOLAX) packet Take 17 g by mouth daily as needed. Mix 17 gm with 4-8 oz of liquid daily for constipation.   Propylene Glycol (SYSTANE BALANCE) 0.6 % SOLN Apply 1 drop to eye 2 (two) times daily. Instill 1 drop each eye twice daily for dry eyes   ranitidine (ZANTAC) 75 MG tablet Take 75 mg by mouth daily as needed for heartburn.   sennosides-docusate sodium (SENOKOT-S) 8.6-50 MG tablet Take 1 tablet by mouth at bedtime.    UNABLE TO FIND Med Name: [place] Pad Cooling. Apply 1 pad to anus up to 6 times a day for pain. May leave at bedside   vitamin B-12 (CYANOCOBALAMIN) 500 MCG tablet Take 500 mcg by mouth daily.   No facility-administered encounter medications on file as of 11/08/2022.    History obtained from review of EMR, interview with facility staff and/or patient.       I reviewed available labs, medications, imaging, studies and related documents from the EMR.  There were no new records/imaging since last visit.    Physical Exam: GENERAL: NAD LUNGS: CTAB, no increased work of breathing, room air CARDIAC:  S1S2, RRR with no MRG,  2+ BLE edema, No cyanosis ABD:  Normo-active BS x 4 quads, soft, non-tender EXTREMITIES:  Wc bound with residual left sided hemiplegia, No muscle atrophy/subcutaneous fat loss NEURO:  Noted generalized weakness, greater on the left, noted cognitive impairment PSYCH:  non-anxious affect, A & O x 2  Thank you for the opportunity to participate in the care of Ryanna Rossi. Please call our main office at [phone] if we can be of additional assistance.    Damaris Hippo FNP-C  [email or web address]  Phone:  [phone]

## 2022-12-18 ENCOUNTER — Non-Acute Institutional Stay: Payer: Medicare Other | Admitting: Family Medicine

## 2022-12-18 ENCOUNTER — Encounter: Payer: Self-pay | Admitting: Family Medicine

## 2022-12-18 VITALS — BP 130/86 | HR 79 | Temp 96.9°F | Resp 14

## 2022-12-18 DIAGNOSIS — F01B3 Vascular dementia, moderate, with mood disturbance: Secondary | ICD-10-CM

## 2022-12-18 DIAGNOSIS — S81802S Unspecified open wound, left lower leg, sequela: Secondary | ICD-10-CM

## 2022-12-18 NOTE — Progress Notes (Signed)
Therapist, nutritional Palliative Care Consult Note Telephone: [phone]  Fax: [phone]   Date of encounter: 12/18/22 9:56 AM PATIENT NAME: Lauren Burch [address] [place] [address]   [phone] (home)  DOB: 1935-10-04 MRN: [id number] PRIMARY CARE PROVIDER:    Karna Dupes, MD,  [address] [place] [address] [phone]  REFERRING PROVIDER:   Karna Dupes, MD [address] [place],  [place] [address] [phone]  Health Care Agent/Health Care Power of Attorney:    Contact Information     Name Relation Home Work Mobile   [place] [phone]          I met face to face with patient in [facility] facility. Palliative Care was asked to follow this patient by consultation request of Karna Dupes, MD to address advance care planning and complex medical decision making. This is a follow up visit.    HEALTH CARE POA AND PHONE NUMBER:  Wynona Dove    CODE STATUS: DNR MOST as of 05/16/21: DNR/DNI with comfort measures Use of antibiotics and IV fluids on a case by case, time limited basis No feeding tube    ASSESSMENT AND / RECOMMENDATIONS:  PPS: 40%  Moderate Vascular Dementia with mood disturbance:  Stable on current regimen. Continue Namenda for cognition and Lexapro/Depakote for mood stabilization.   Reorient and assist as needed.   Wound to L Posterior Calf  Healing without s/sx of infection. Continue wound care per PCP order.   Staff to monitor for s/sx cellulitis-erythema, drainage, increased tenderness or heat and notify provider if present.      Follow up Palliative Care Visit:  Palliative Care continuing to follow up by monitoring for changes in appetite, weight, functional and cognitive status for chronic disease progression and management in agreement with patient's stated goals of care. Next visit in 4 weeks or prn.  This visit was coded based on medical decision making (MDM).  Chief  Complaint  Palliative Care is continuing to follow patient for chronic medical management in setting of dementia.    HISTORY OF PRESENT ILLNESS:  Lauren Burch is a 87 y.o. year old female with hx of stroke and seizure disorder in setting of dementia.  Pt fell in bathroom on her side when trying to get up off the toilet and hit her side about 2 weeks ago with no residual pain or limitation in movement.  Denies CP, has SOB when singing only.  Urinary burning which is unchanged.  No medication changes.  No acute complaints presented from patient or staff.    ACTIVITIES OF DAILY LIVING: CONTINENT OF BLADDER/ BOWEL? No BATHING/DRESSING/FEEDING: independent with feeding, mod assistance needed with bathing/dressing  MOBILITY:   AMBULATORY WITH ASSISTIVE DEVICE: WHEELCHAIR bound   APPETITE? good  CURRENT PROBLEM LIST:  Patient Active Problem List   Diagnosis Date Noted   Shortness of breath 08/12/2022   Mood disorder with psychosis 12/05/2016   Hemiplegia affecting left side in left-dominant patient as late effect of cerebrovascular disease 05/31/2016   Anemia of chronic kidney failure 04/25/2016   CKD (chronic kidney disease) stage 2, GFR 60-89 ml/min 04/25/2016   History of CVA (cerebrovascular accident) 03/20/2016   Age related osteoporosis 11/18/2015   Vascular dementia 11/18/2015   Functional urinary incontinence 11/18/2015   Constipation, chronic 11/18/2015   Coronary artery disease involving native coronary artery of native heart with angina pectoris 11/18/2015   Unsteady gait 09/19/2015   Peripheral polyneuropathy 09/19/2015   OAB (overactive  bladder) 09/19/2015   Frequent falls 06/03/2015   Major depressive disorder, recurrent episode, mild 02/25/2015   Vitamin D deficiency 01/27/2015   Esophageal reflux 01/27/2015   Vaginitis and vulvovaginitis 01/27/2015   Rhinitis, allergic 01/04/2015   Anemia of chronic disease 01/04/2015   Acquired hypothyroidism 01/04/2015   Thyroid  activity decreased 12/09/2014   B12 deficiency anemia 12/09/2014   Hyperlipidemia 08/16/2014   Seizure disorder 08/16/2014   CVA (cerebral vascular accident) 08/16/2014   CN (constipation) 08/16/2014   Essential tremor 08/16/2014   Allergy to pollen 05/05/2013   Other convulsions 03/05/2013   Late effects of cerebrovascular disease 03/05/2013   Pure hypercholesterolemia 03/05/2013   Constipation 03/05/2013   PAST MEDICAL HISTORY:  Active Ambulatory Problems    Diagnosis Date Noted   Other convulsions 03/05/2013   Late effects of cerebrovascular disease 03/05/2013   Pure hypercholesterolemia 03/05/2013   Constipation 03/05/2013   Allergy to pollen 05/05/2013   Hyperlipidemia 08/16/2014   Seizure disorder 08/16/2014   CVA (cerebral vascular accident) 08/16/2014   CN (constipation) 08/16/2014   Essential tremor 08/16/2014   Thyroid activity decreased 12/09/2014   B12 deficiency anemia 12/09/2014   Rhinitis, allergic 01/04/2015   Anemia of chronic disease 01/04/2015   Acquired hypothyroidism 01/04/2015   Vitamin D deficiency 01/27/2015   Esophageal reflux 01/27/2015   Vaginitis and vulvovaginitis 01/27/2015   Major depressive disorder, recurrent episode, mild 02/25/2015   Frequent falls 06/03/2015   Unsteady gait 09/19/2015   Peripheral polyneuropathy 09/19/2015   OAB (overactive bladder) 09/19/2015   Age related osteoporosis 11/18/2015   Vascular dementia 11/18/2015   Functional urinary incontinence 11/18/2015   Constipation, chronic 11/18/2015   Coronary artery disease involving native coronary artery of native heart with angina pectoris 11/18/2015   History of CVA (cerebrovascular accident) 03/20/2016   Anemia of chronic kidney failure 04/25/2016   CKD (chronic kidney disease) stage 2, GFR 60-89 ml/min 04/25/2016   Hemiplegia affecting left side in left-dominant patient as late effect of cerebrovascular disease 05/31/2016   Mood disorder with psychosis 12/05/2016    Shortness of breath 08/12/2022   Resolved Ambulatory Problems    Diagnosis Date Noted   GERD (gastroesophageal reflux disease) 05/05/2013   Past Medical History:  Diagnosis Date   CVA (cerebral infarction)    Seizures    TBI (traumatic brain injury)    SOCIAL HX:  Social History   Tobacco Use   Smoking status: Former   Smokeless tobacco: Not on file  Substance Use Topics   Alcohol use: Not on file   FAMILY HX: No family history on file.     Preferred Pharmacy: ALLERGIES:  Allergies  Allergen Reactions   Lasix [Furosemide]    Latex      PERTINENT MEDICATIONS:  Outpatient Encounter Medications as of 12/18/2022  Medication Sig   acetaminophen (TYLENOL) 325 MG tablet Take 650 mg by mouth every 4 (four) hours as needed for mild pain.   Calcium Carbonate-Vitamin D (CALTRATE 600+D) 600-400 MG-UNIT per tablet Take 1 tablet by mouth 2 (two) times daily. For osteoporosis   cholecalciferol (VITAMIN D) 1000 UNITS tablet Take 1,000 Units by mouth daily. Take 1 tablet by mouth daily for senile osteoporosis   clopidogrel (PLAVIX) 75 MG tablet Take 75 mg by mouth daily. Take 1 tablet by mouth daily for CVA. Take with meals   divalproex (DEPAKOTE SPRINKLE) 125 MG capsule Take 125 mg by mouth at bedtime.    escitalopram (LEXAPRO) 10 MG tablet Take 10 mg by mouth daily.  fluconazole (DIFLUCAN) 200 MG tablet 1 by mouth once weekly (Saturday) for vulvovaginitis   fluticasone (FLONASE) 50 MCG/ACT nasal spray Place 2 sprays into both nostrils daily.   gabapentin (NEURONTIN) 100 MG capsule Take 100 mg by mouth daily. In the morning   gabapentin (NEURONTIN) 100 MG capsule Take 200 mg by mouth at bedtime.   guaiFENesin (ROBITUSSIN) 100 MG/5ML SOLN Take 10 mLs by mouth every 4 (four) hours as needed for cough.   lactulose (CHRONULAC) 10 GM/15ML solution Take by mouth 2 (two) times daily. Give 30 cc   levothyroxine (SYNTHROID, LEVOTHROID) 25 MCG tablet Take 25 mcg by mouth daily.    loperamide  (IMODIUM A-D) 2 MG tablet Take 2 mg by mouth 4 (four) times daily as needed for diarrhea or loose stools.   loratadine (CLARITIN) 10 MG tablet Take 10 mg by mouth daily as needed for allergies.   memantine (NAMENDA XR) 28 MG CP24 24 hr capsule Take 28 mg by mouth daily.   Menthol, Topical Analgesic, (BIOFREEZE) 4 % GEL Apply 1 application topically 2 (two) times daily. 8 am and 2 pm   mirabegron ER (MYRBETRIQ) 25 MG TB24 tablet Take 25 mg by mouth daily.   nitroGLYCERIN (NITROSTAT) 0.4 MG SL tablet Place 0.4 mg under the tongue every 5 (five) minutes as needed for chest pain (repeat for 3 doses prn).   PHENobarbital (LUMINAL) 64.8 MG tablet Take one tablet by mouth twice daily for seizures   phenytoin (DILANTIN) 100 MG ER capsule Take 100 mg by mouth 3 (three) times daily.    polyethylene glycol (MIRALAX / GLYCOLAX) packet Take 17 g by mouth daily as needed. Mix 17 gm with 4-8 oz of liquid daily for constipation.   Propylene Glycol (SYSTANE BALANCE) 0.6 % SOLN Apply 1 drop to eye 2 (two) times daily. Instill 1 drop each eye twice daily for dry eyes   ranitidine (ZANTAC) 75 MG tablet Take 75 mg by mouth daily as needed for heartburn.   sennosides-docusate sodium (SENOKOT-S) 8.6-50 MG tablet Take 1 tablet by mouth at bedtime.    UNABLE TO FIND Med Name: [place] [facility] Pad Cooling. Apply 1 pad to anus up to 6 times a day for pain. May leave at bedside   vitamin B-12 (CYANOCOBALAMIN) 500 MCG tablet Take 500 mcg by mouth daily.   No facility-administered encounter medications on file as of 12/18/2022.    History obtained from review of EMR, discussion with facility staff/caregiver and/or patient.    I reviewed available labs, medications, imaging, studies and related documents from the EMR -there are no new records/imaging since last visit.   Physical Exam: GENERAL: NAD LUNGS: Coarse upper lobes, clear BLL, no increased work of breathing, room air CARDIAC:  S1S2, RRR with no MRG, Noted 1+ Right  ankle/2+ left ankle edema.  No cyanosis ABD:  hypo-active BS x 4 quads, soft, non-tender EXTREMITIES: Normal ROM, no deformity, No muscle atrophy/subcutaneous fat loss SKIN: Dressing to left posterior calf covering partially scabbed skin tear, no surrounding heat/erythema NEURO:  Noted BLE weakness, some short term impairment of timing of certain events, otherwise cognitively intact PSYCH:  non-anxious affect, A & O x 3  Thank you for the opportunity to participate in the care of Lauren Burch. Please call our main office at [phone] if we can be of additional assistance.    Joycelyn ManKaren Wasyl Dornfeld FNP-C  [email or web address]  Phone:  [phone]

## 2022-12-21 DIAGNOSIS — S81802S Unspecified open wound, left lower leg, sequela: Secondary | ICD-10-CM | POA: Insufficient documentation

## 2023-02-20 ENCOUNTER — Encounter: Payer: Self-pay | Admitting: Family Medicine

## 2023-02-20 ENCOUNTER — Non-Acute Institutional Stay: Payer: Medicare Other | Admitting: Family Medicine

## 2023-02-20 VITALS — HR 90 | Temp 98.6°F | Resp 20

## 2023-02-20 DIAGNOSIS — H1031 Unspecified acute conjunctivitis, right eye: Secondary | ICD-10-CM

## 2023-02-20 DIAGNOSIS — F01B3 Vascular dementia, moderate, with mood disturbance: Secondary | ICD-10-CM

## 2023-02-20 NOTE — Progress Notes (Signed)
Therapist, nutritional Palliative Care Consult Note Telephone: [phone]  Fax: [phone]   Date of encounter: 02/20/23 9:22 PM PATIENT NAME: Lauren Burch [address] [place] [address]   [phone] (home)  DOB: 03/16/1936 MRN: [id number] PRIMARY CARE PROVIDER:    Karna Dupes, MD,  [address] [place] [address] [phone]  REFERRING PROVIDER:   Karna Dupes, MD [address] [place],  [place] [address] [phone]  Health Care Agent/Health Care Power of Attorney:    Contact Information     Name Relation Home Work Mobile   Marianne Sofia [phone]          I met face to face with patient in [facility]. Palliative Care was asked to follow this patient by consultation request of Karna Dupes, MD to address advance care planning and complex medical decision making. This is a follow up visit.    HEALTH CARE POA AND PHONE NUMBER:  Wynona Dove    CODE STATUS: DNR MOST as of 05/16/21: DNR/DNI with comfort measures Use of antibiotics and IV fluids on a case by case, time limited basis No feeding tube    ASSESSMENT AND / RECOMMENDATIONS:  PPS: 40%  Moderate Vascular Dementia with mood disturbance:  Continue Namenda 14 mg every other day with decrease in dose on 02/04/23. Continue Risperidone 0.5 mg Q am and 1.0 mg QHS for mood stabilization. Continue Lexapro for depression and anxiety.  2. Acute bacterial conjunctivitis Place warm compresses over eyes to alleviate matting, use Q tip and no more tears shampoo if matted closed to help open. Wash hands, wear gloves and place 2 drops at [place] of each eye QID and keep eye closed for 1 -2 minutes to allow medicine to work. Schedule for 6 am, 12 pm, 6 pm and 10 pm drops x 7 days. Staff to monitor for s/sx cellulitis-erythema, increased edema drainage, increased tenderness or heat with fever, malaise, pain with eye movement or  photosensitivity.      Follow up Palliative Care Visit:  Palliative Care continuing to follow up by monitoring for changes in appetite, weight, functional and cognitive status for chronic disease progression and management in agreement with patient's stated goals of care. Next visit in 4 weeks or prn.  This visit was coded based on medical decision making (MDM).  Chief Complaint  Palliative Care is continuing to follow patient for chronic medical management in setting of dementia.    HISTORY OF PRESENT ILLNESS:  Lauren Burch is a 87 y.o. year old female with hx of stroke and seizure disorder in setting of dementia. Pt has c/o new onset itching and burning of right eye.  Denies pain with eye movement or foreign body sensation.  Cannot say how long her eye has been in said state and staff were unaware of pt having drainage. Denies CP, continues to c/o SOB when singing but denies worsening. Denies falls.   ACTIVITIES OF DAILY LIVING: CONTINENT OF BLADDER/ BOWEL? No BATHING/DRESSING/FEEDING: independent with feeding, mod assistance needed with bathing/dressing  MOBILITY:   AMBULATORY WITH ASSISTIVE DEVICE: WHEELCHAIR bound   APPETITE? Good Weight 178.6 lbs on 02/11/23, 182.2 lbs on 12/10/22  CURRENT PROBLEM LIST:  Patient Active Problem List   Diagnosis Date Noted   Acute bacterial conjunctivitis of right eye 02/20/2023   Wound of left leg, sequela 12/21/2022   Shortness of breath 08/12/2022   Mood disorder with psychosis (HCC) 12/05/2016   Hemiplegia affecting left  side in left-dominant patient as late effect of cerebrovascular disease (HCC) 05/31/2016   Anemia of chronic kidney failure 04/25/2016   CKD (chronic kidney disease) stage 2, GFR 60-89 ml/min 04/25/2016   History of CVA (cerebrovascular accident) 03/20/2016   Age related osteoporosis 11/18/2015   Vascular dementia (HCC) 11/18/2015   Functional urinary incontinence 11/18/2015   Constipation, chronic 11/18/2015   Coronary  artery disease involving native coronary artery of native heart with angina pectoris (HCC) 11/18/2015   Unsteady gait 09/19/2015   Peripheral polyneuropathy 09/19/2015   OAB (overactive bladder) 09/19/2015   Frequent falls 06/03/2015   Major depressive disorder, recurrent episode, mild (HCC) 02/25/2015   Vitamin D deficiency 01/27/2015   Esophageal reflux 01/27/2015   Vaginitis and vulvovaginitis 01/27/2015   Rhinitis, allergic 01/04/2015   Anemia of chronic disease 01/04/2015   Acquired hypothyroidism 01/04/2015   Thyroid activity decreased 12/09/2014   B12 deficiency anemia 12/09/2014   Hyperlipidemia 08/16/2014   Seizure disorder (HCC) 08/16/2014   CVA (cerebral vascular accident) (HCC) 08/16/2014   CN (constipation) 08/16/2014   Essential tremor 08/16/2014   Allergy to pollen 05/05/2013   Other convulsions 03/05/2013   Late effects of cerebrovascular disease 03/05/2013   Pure hypercholesterolemia 03/05/2013   Constipation 03/05/2013   PAST MEDICAL HISTORY:  Active Ambulatory Problems    Diagnosis Date Noted   Other convulsions 03/05/2013   Late effects of cerebrovascular disease 03/05/2013   Pure hypercholesterolemia 03/05/2013   Constipation 03/05/2013   Allergy to pollen 05/05/2013   Hyperlipidemia 08/16/2014   Seizure disorder (HCC) 08/16/2014   CVA (cerebral vascular accident) (HCC) 08/16/2014   CN (constipation) 08/16/2014   Essential tremor 08/16/2014   Thyroid activity decreased 12/09/2014   B12 deficiency anemia 12/09/2014   Rhinitis, allergic 01/04/2015   Anemia of chronic disease 01/04/2015   Acquired hypothyroidism 01/04/2015   Vitamin D deficiency 01/27/2015   Esophageal reflux 01/27/2015   Vaginitis and vulvovaginitis 01/27/2015   Major depressive disorder, recurrent episode, mild (HCC) 02/25/2015   Frequent falls 06/03/2015   Unsteady gait 09/19/2015   Peripheral polyneuropathy 09/19/2015   OAB (overactive bladder) 09/19/2015   Age related  osteoporosis 11/18/2015   Vascular dementia (HCC) 11/18/2015   Functional urinary incontinence 11/18/2015   Constipation, chronic 11/18/2015   Coronary artery disease involving native coronary artery of native heart with angina pectoris (HCC) 11/18/2015   History of CVA (cerebrovascular accident) 03/20/2016   Anemia of chronic kidney failure 04/25/2016   CKD (chronic kidney disease) stage 2, GFR 60-89 ml/min 04/25/2016   Hemiplegia affecting left side in left-dominant patient as late effect of cerebrovascular disease (HCC) 05/31/2016   Mood disorder with psychosis (HCC) 12/05/2016   Shortness of breath 08/12/2022   Wound of left leg, sequela 12/21/2022   Acute bacterial conjunctivitis of right eye 02/20/2023   Resolved Ambulatory Problems    Diagnosis Date Noted   GERD (gastroesophageal reflux disease) 05/05/2013   Past Medical History:  Diagnosis Date   CVA (cerebral infarction)    Seizures (HCC)    TBI (traumatic brain injury) (HCC)    SOCIAL HX:  Social History   Tobacco Use   Smoking status: Former   Smokeless tobacco: Not on file  Substance Use Topics   Alcohol use: Not on file   FAMILY HX: No family history on file.     Preferred Pharmacy: ALLERGIES:  Allergies  Allergen Reactions   Lasix [Furosemide]    Latex      PERTINENT MEDICATIONS:  Outpatient Encounter Medications as of  02/20/2023  Medication Sig   acetaminophen (TYLENOL) 325 MG tablet Take 650 mg by mouth every 4 (four) hours as needed for mild pain.   Amantadine HCl 100 MG tablet Take 50 mg by mouth 2 (two) times daily.   Calcium Carbonate-Vitamin D (CALTRATE 600+D) 600-400 MG-UNIT per tablet Take 1 tablet by mouth 2 (two) times daily. For osteoporosis   cholecalciferol (VITAMIN D) 1000 UNITS tablet Take 1,000 Units by mouth daily. Take 1 tablet by mouth daily for senile osteoporosis   [START ON 02/21/2023] ciprofloxacin (CILOXAN) 0.3 % ophthalmic solution Place 2 drops into both eyes 4 (four) times  daily. Administer 2 drops each eye at 6 am, 12 pm, 6 pm and 10 pm x 7 days   clopidogrel (PLAVIX) 75 MG tablet Take 75 mg by mouth daily. Take 1 tablet by mouth daily for CVA. Take with meals   escitalopram (LEXAPRO) 10 MG tablet Take 10 mg by mouth daily.   lactulose (CHRONULAC) 10 GM/15ML solution Take by mouth 2 (two) times daily. Give 30 cc   levothyroxine (SYNTHROID, LEVOTHROID) 25 MCG tablet Take 25 mcg by mouth daily.    loratadine (CLARITIN) 10 MG tablet Take 10 mg by mouth daily as needed for allergies.   memantine (NAMENDA XR) 14 MG CP24 24 hr capsule Take 14 mg by mouth every other day.   PHENobarbital (LUMINAL) 64.8 MG tablet Take one tablet by mouth twice daily for seizures   phenytoin (DILANTIN) 100 MG ER capsule Take 100-200 mg by mouth as directed. 100 mg BID and 200 mg QHS   polyethylene glycol (MIRALAX / GLYCOLAX) packet Take 17 g by mouth daily as needed. Mix 17 gm with 4-8 oz of liquid daily for constipation.   risperiDONE (RISPERDAL) 1 MG tablet Take 0.5-1 mg by mouth as directed. 0.5 mg daily in am, 1.0 mg QHS   sennosides-docusate sodium (SENOKOT-S) 8.6-50 MG tablet Take 1 tablet by mouth at bedtime.    vitamin B-12 (CYANOCOBALAMIN) 500 MCG tablet Take 500 mcg by mouth daily.   nitroGLYCERIN (NITROSTAT) 0.4 MG SL tablet Place 0.4 mg under the tongue every 5 (five) minutes as needed for chest pain (repeat for 3 doses prn).   [DISCONTINUED] divalproex (DEPAKOTE SPRINKLE) 125 MG capsule Take 125 mg by mouth at bedtime.    [DISCONTINUED] fluconazole (DIFLUCAN) 200 MG tablet 1 by mouth once weekly (Saturday) for vulvovaginitis   [DISCONTINUED] fluticasone (FLONASE) 50 MCG/ACT nasal spray Place 2 sprays into both nostrils daily.   [DISCONTINUED] gabapentin (NEURONTIN) 100 MG capsule Take 100 mg by mouth daily. In the morning   [DISCONTINUED] gabapentin (NEURONTIN) 100 MG capsule Take 200 mg by mouth at bedtime.   [DISCONTINUED] guaiFENesin (ROBITUSSIN) 100 MG/5ML SOLN Take 10 mLs  by mouth every 4 (four) hours as needed for cough.   [DISCONTINUED] loperamide (IMODIUM A-D) 2 MG tablet Take 2 mg by mouth 4 (four) times daily as needed for diarrhea or loose stools.   [DISCONTINUED] Menthol, Topical Analgesic, (BIOFREEZE) 4 % GEL Apply 1 application topically 2 (two) times daily. 8 am and 2 pm   [DISCONTINUED] mirabegron ER (MYRBETRIQ) 25 MG TB24 tablet Take 25 mg by mouth daily.   [DISCONTINUED] Propylene Glycol (SYSTANE BALANCE) 0.6 % SOLN Apply 1 drop to eye 2 (two) times daily. Instill 1 drop each eye twice daily for dry eyes   [DISCONTINUED] ranitidine (ZANTAC) 75 MG tablet Take 75 mg by mouth daily as needed for heartburn.   [DISCONTINUED] UNABLE TO FIND Med Name: [employer]  Pad Cooling. Apply 1 pad to anus up to 6 times a day for pain. May leave at bedside   No facility-administered encounter medications on file as of 02/20/2023.    History obtained from review of EMR, discussion with facility staff/caregiver and/or patient.    I reviewed available labs, medications, imaging, studies and related documents from the EMR -there are no new records/imaging since last visit.   Physical Exam: GENERAL: NAD EENT:  right eye with erythema and mild increased heat of bilateral eyelids with stringy yellow d/c partially sticking the upper and lower lid together, scleral injection.  Left eye with mild erythema but no d/c. LUNGS: CTAB, no increased work of breathing, room air CARDIAC:  S1S2, RRR with no MRG, Noted 1+non-pitting bilat ankle edema.  No cyanosis ABD:  hypo-active BS x 4 quads, soft, non-tender EXTREMITIES: Normal ROM, no deformity, No muscle atrophy/subcutaneous fat loss NEURO:  Noted BLE weakness, some short term memory impairment and difficulty word finding, otherwise cognitively intact PSYCH:  non-anxious affect, A & O x 2  Thank you for the opportunity to participate in the care of Kasee Grisanti. Please call our main office at [phone] if we can be of  additional assistance.    Joycelyn Man FNP-C  [facility]  Phone:  [phone]
